# Patient Record
Sex: Female | Born: 1990 | Race: Black or African American | Hispanic: No | Marital: Single | Smoking: Current every day smoker
Health system: Southern US, Community
[De-identification: ages and names within clinical notes are randomized; demographics above are authoritative.]

## PROBLEM LIST (undated history)

## (undated) ENCOUNTER — Inpatient Hospital Stay (HOSPITAL_COMMUNITY): Payer: Self-pay

## (undated) DIAGNOSIS — F419 Anxiety disorder, unspecified: Secondary | ICD-10-CM

## (undated) DIAGNOSIS — I10 Essential (primary) hypertension: Secondary | ICD-10-CM

## (undated) DIAGNOSIS — J45909 Unspecified asthma, uncomplicated: Secondary | ICD-10-CM

## (undated) DIAGNOSIS — Z8632 Personal history of gestational diabetes: Secondary | ICD-10-CM

## (undated) HISTORY — PX: DILATION AND CURETTAGE OF UTERUS: SHX78

## (undated) HISTORY — DX: Personal history of gestational diabetes: Z86.32

## (undated) HISTORY — PX: TONSILLECTOMY: SUR1361

---
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
## 2008-01-18 ENCOUNTER — Inpatient Hospital Stay (HOSPITAL_COMMUNITY): Admission: AD | Admit: 2008-01-18 | Discharge: 2008-01-18 | Payer: Self-pay | Admitting: Obstetrics

## 2008-02-16 ENCOUNTER — Inpatient Hospital Stay (HOSPITAL_COMMUNITY): Admission: AD | Admit: 2008-02-16 | Discharge: 2008-02-19 | Payer: Self-pay | Admitting: Obstetrics & Gynecology

## 2008-02-16 ENCOUNTER — Inpatient Hospital Stay (HOSPITAL_COMMUNITY): Admission: AD | Admit: 2008-02-16 | Discharge: 2008-02-16 | Payer: Self-pay | Admitting: Obstetrics & Gynecology

## 2009-10-21 ENCOUNTER — Emergency Department (HOSPITAL_COMMUNITY): Admission: EM | Admit: 2009-10-21 | Discharge: 2009-10-22 | Payer: Self-pay | Admitting: Emergency Medicine

## 2010-02-28 ENCOUNTER — Ambulatory Visit: Payer: Self-pay | Admitting: Gynecology

## 2010-02-28 ENCOUNTER — Inpatient Hospital Stay (HOSPITAL_COMMUNITY)
Admission: AD | Admit: 2010-02-28 | Discharge: 2010-02-28 | Payer: Self-pay | Source: Home / Self Care | Admitting: Obstetrics and Gynecology

## 2010-03-01 ENCOUNTER — Ambulatory Visit: Payer: Self-pay | Admitting: Obstetrics and Gynecology

## 2010-03-01 ENCOUNTER — Inpatient Hospital Stay (HOSPITAL_COMMUNITY)
Admission: AD | Admit: 2010-03-01 | Discharge: 2010-03-01 | Payer: Self-pay | Source: Home / Self Care | Admitting: Obstetrics & Gynecology

## 2010-06-30 ENCOUNTER — Encounter: Payer: Self-pay | Admitting: Obstetrics & Gynecology

## 2010-07-11 ENCOUNTER — Inpatient Hospital Stay (HOSPITAL_COMMUNITY)
Admission: AD | Admit: 2010-07-11 | Discharge: 2010-07-11 | Disposition: A | Discharge status: Home / Self Care | Payer: Self-pay | Source: Ambulatory Visit | Attending: Obstetrics | Admitting: Obstetrics

## 2010-07-11 DIAGNOSIS — O4700 False labor before 37 completed weeks of gestation, unspecified trimester: Secondary | ICD-10-CM

## 2010-07-11 LAB — URINE MICROSCOPIC-ADD ON

## 2010-07-11 LAB — URINALYSIS, ROUTINE W REFLEX MICROSCOPIC
Bilirubin Urine: NEGATIVE
Leukocytes, UA: NEGATIVE
Nitrite: NEGATIVE
Protein, ur: NEGATIVE mg/dL
Specific Gravity, Urine: <1.005 — ABNORMAL LOW (ref 1.005–1.030)
pH: 6 (ref 5.0–8.0)

## 2010-07-13 LAB — URINE CULTURE

## 2010-08-02 ENCOUNTER — Inpatient Hospital Stay (HOSPITAL_COMMUNITY)
Admission: AD | Admit: 2010-08-02 | Discharge: 2010-08-02 | Disposition: A | Discharge status: Home / Self Care | Payer: Medicaid Other | Source: Ambulatory Visit | Attending: Obstetrics & Gynecology | Admitting: Obstetrics & Gynecology

## 2010-08-02 DIAGNOSIS — O139 Gestational [pregnancy-induced] hypertension without significant proteinuria, unspecified trimester: Secondary | ICD-10-CM

## 2010-08-02 LAB — COMPREHENSIVE METABOLIC PANEL
ALT: 12 U/L (ref 0–35)
AST: 18 U/L (ref 0–37)
Alkaline Phosphatase: 226 U/L — ABNORMAL HIGH (ref 39–117)
BUN: 3 mg/dL — ABNORMAL LOW (ref 6–23)
Creatinine, Ser: 0.49 mg/dL (ref 0.4–1.2)
GFR calc non Af Amer: >60 mL/min (ref >60)
Total Bilirubin: 0.9 mg/dL (ref 0.3–1.2)
Total Protein: 5.6 g/dL — ABNORMAL LOW (ref 6.0–8.3)

## 2010-08-02 LAB — URINALYSIS, ROUTINE W REFLEX MICROSCOPIC
Hgb urine dipstick: NEGATIVE
Ketones, ur: NEGATIVE mg/dL
Specific Gravity, Urine: 1.015 (ref 1.005–1.030)
Urine Glucose, Fasting: NEGATIVE mg/dL
pH: 6 (ref 5.0–8.0)

## 2010-08-02 LAB — CBC
Hemoglobin: 12.5 g/dL (ref 12.0–15.0)
MCHC: 35 g/dL (ref 30.0–36.0)
MCV: 93.9 fL (ref 78.0–100.0)
RBC: 3.8 MIL/uL — ABNORMAL LOW (ref 3.87–5.11)
RDW: 13.1 % (ref 11.5–15.5)
WBC: 7.7 10*3/uL (ref 4.0–10.5)

## 2010-08-02 LAB — URIC ACID: Uric Acid, Serum: 5.3 mg/dL (ref 2.4–7.0)

## 2010-08-15 ENCOUNTER — Inpatient Hospital Stay (HOSPITAL_COMMUNITY): Admission: AD | Admit: 2010-08-15 | Payer: Self-pay | Admitting: Obstetrics & Gynecology

## 2010-08-17 ENCOUNTER — Inpatient Hospital Stay (HOSPITAL_COMMUNITY)
Admission: AD | Admit: 2010-08-17 | Discharge: 2010-08-20 | DRG: 775 | Disposition: A | Discharge status: Home / Self Care | Payer: Medicaid Other | Source: Ambulatory Visit | Attending: Obstetrics | Admitting: Obstetrics

## 2010-08-17 DIAGNOSIS — Z22330 Carrier of Group B streptococcus: Secondary | ICD-10-CM

## 2010-08-17 DIAGNOSIS — O99892 Other specified diseases and conditions complicating childbirth: Principal | ICD-10-CM | POA: Diagnosis present

## 2010-08-17 LAB — CBC
HCT: 37.5 % (ref 36.0–46.0)
MCHC: 35.2 g/dL (ref 30.0–36.0)
MCV: 93.1 fL (ref 78.0–100.0)
RBC: 4.03 MIL/uL (ref 3.87–5.11)
WBC: 8.4 10*3/uL (ref 4.0–10.5)

## 2010-08-19 LAB — CBC
Hemoglobin: 12.3 g/dL (ref 12.0–15.0)
MCH: 32.5 pg (ref 26.0–34.0)
MCHC: 34.3 g/dL (ref 30.0–36.0)
Platelets: 124 10*3/uL — ABNORMAL LOW (ref 150–400)
RDW: 13.2 % (ref 11.5–15.5)

## 2010-08-22 LAB — CBC
HCT: 35.9 % — ABNORMAL LOW (ref 36.0–46.0)
HCT: 36.2 % (ref 36.0–46.0)
Hemoglobin: 12.5 g/dL (ref 12.0–15.0)
Hemoglobin: 12.6 g/dL (ref 12.0–15.0)
MCHC: 34.7 g/dL (ref 30.0–36.0)
RBC: 3.83 MIL/uL — ABNORMAL LOW (ref 3.87–5.11)
WBC: 6.6 10*3/uL (ref 4.0–10.5)

## 2010-08-22 LAB — COMPREHENSIVE METABOLIC PANEL
ALT: 11 U/L (ref 0–35)
ALT: 12 U/L (ref 0–35)
Alkaline Phosphatase: 70 U/L (ref 39–117)
Alkaline Phosphatase: 73 U/L (ref 39–117)
CO2: 23 mEq/L (ref 19–32)
CO2: 27 mEq/L (ref 19–32)
Calcium: 8.6 mg/dL (ref 8.4–10.5)
GFR calc non Af Amer: >60 mL/min (ref >60)
GFR calc non Af Amer: >60 mL/min (ref >60)
Glucose, Bld: 81 mg/dL (ref 70–99)
Glucose, Bld: 95 mg/dL (ref 70–99)
Potassium: 3 mEq/L — ABNORMAL LOW (ref 3.5–5.1)
Sodium: 134 mEq/L — ABNORMAL LOW (ref 135–145)
Sodium: 137 mEq/L (ref 135–145)
Total Protein: 6.1 g/dL (ref 6.0–8.3)

## 2010-08-22 LAB — URINALYSIS, ROUTINE W REFLEX MICROSCOPIC
Glucose, UA: NEGATIVE mg/dL
Ketones, ur: NEGATIVE mg/dL
Specific Gravity, Urine: 1.025 (ref 1.005–1.030)
Urobilinogen, UA: 0.2 mg/dL (ref 0.0–1.0)
pH: 5.5 (ref 5.0–8.0)

## 2010-08-22 LAB — HCG, QUANTITATIVE, PREGNANCY: hCG, Beta Chain, Quant, S: 15085 m[IU]/mL — ABNORMAL HIGH (ref <5)

## 2010-08-22 LAB — WET PREP, GENITAL

## 2010-08-26 LAB — WET PREP, GENITAL
WBC, Wet Prep HPF POC: NONE SEEN
Yeast Wet Prep HPF POC: NONE SEEN

## 2010-08-26 LAB — POCT PREGNANCY, URINE: Preg Test, Ur: NEGATIVE

## 2010-08-26 LAB — URINALYSIS, ROUTINE W REFLEX MICROSCOPIC
Glucose, UA: NEGATIVE mg/dL
Ketones, ur: 15 mg/dL — AB
Protein, ur: NEGATIVE mg/dL

## 2010-08-26 LAB — GC/CHLAMYDIA PROBE AMP, GENITAL
Chlamydia, DNA Probe: NEGATIVE
GC Probe Amp, Genital: NEGATIVE

## 2010-08-26 LAB — URINE CULTURE

## 2010-09-26 NOTE — Discharge Summary (Signed)
  Abigail Hardin, Abigail Hardin                ACCOUNT NO.:  [id number]  MEDICAL RECORD NO.:  [id number]           PATIENT TYPE:  I  LOCATION:  9134                          FACILITY:  [facility]  PHYSICIAN:  Charles A. Clearance Coots, M.D.DATE OF BIRTH:  1991/01/19  DATE OF ADMISSION:  08/17/2010 DATE OF DISCHARGE:  08/20/2010                              DISCHARGE SUMMARY   ADMITTING DIAGNOSIS:  Term pregnancy, early labor.  DISCHARGE DIAGNOSIS:  Term pregnancy, early labor, status post normal spontaneous vaginal delivery.  Viable female infant on August 18, 2010, at 0803.  Apgars 9 at 1 minute and 9 at 5 minutes, weight of 3115 g, length of 48.90 cm.  Mother and infant discharged home in good condition.  REASON FOR ADMISSION:  A 20 year old G2, P1 estimated date of confinement, August 15, 2010, presents with uterine contractions.  PAST MEDICAL HISTORY:  Please see medical records for full historical data.  PHYSICAL EXAM:  GENERAL:  Well-nourished, well-developed female in no acute distress, afebrile. VITAL SIGNS:  Stable. LUNGS:  Clear to auscultation bilaterally. HEART:  Regular rate and rhythm. ABDOMEN:  Soft, gravid, nontender. PELVIC:  Cervix 3 cm, 80% effacement and vertex at -1 station.  ADMITTING LABORATORY FINDINGS:  Hemoglobin 12, hematocrit 37, white blood cell count 8400, platelets 146,000.  RPR was nonreactive.  HOSPITAL COURSE:  The patient was admitted and membranes were actively ruptured and she progressed very rapidly to normal spontaneous vaginal delivery without complications.  Postpartum course was uncomplicated. She was discharged home on postpartum day #2 in good condition.  DISCHARGE LABORATORY FINDINGS:  Hemoglobin 12, hematocrit 35, white blood cell count 10,000, platelets 114,000.  DISCHARGE DISPOSITION:  Medications, continue prenatal vitamins. Ibuprofen was prescribed for pain.  Routine written instructions were given for discharge after vaginal delivery.  The patient  is to call office for followup appointment in 6 weeks.     Charles A. Clearance Coots, M.D.     CAH/MEDQ  D:  09/12/2010  T:  09/13/2010  Job:  [id number]  Electronically Signed by Coral Ceo M.D. on 09/26/2010 02:30:18 PM

## 2010-10-25 NOTE — H&P (Signed)
Abigail Hardin, Abigail Hardin                ACCOUNT NO.:  [id number]   MEDICAL RECORD NO.:  [id number]          PATIENT TYPE:  INP   LOCATION:  9304                          FACILITY:  [facility]   PHYSICIAN:  Naima A. Dillard, M.D. DATE OF BIRTH:  May 07, 1991   DATE OF ADMISSION:  11/23/2005  DATE OF DISCHARGE:                                HISTORY & PHYSICAL   CHIEF COMPLAINT:  Pelvic inflammatory disease and pyelonephritis.   HISTORY OF PRESENT ILLNESS:  The patient is a 20 year old, gravida 1, para 0-  0-1-0, with last menstrual period November 16, 2005, who presented to the ER  with nausea and vomiting, lower abdominal pain and back pain with headaches  for two days.  The patient says that she last had sex two months ago using  condoms most of the time.  Never had any sexually transmitted disease or PID  in the past.  The patient denies being allergic to any medication. She is  currently not taking any medication.   PAST MEDICAL HISTORY:  Unremarkable.   PAST SURGICAL HISTORY:  Significant for tonsillectomy at a young age.   PAST OB HISTORY:  Significant for spontaneous abortion in the first  trimester when she had a dilatation and curettage.   REVIEW OF SYSTEMS:  Cardiovascular, respiratory, musculoskeletal,  psychiatric, and endocrine are all unremarkable.  GU:  As above.   FAMILY HISTORY:  Unremarkable.   SOCIAL HISTORY:  Significant for tobacco and marijuana use.  No alcohol use.   PHYSICAL EXAMINATION:  VITAL SIGNS:  T-max in the ER this morning was 103.3.  Current temperature 99.9.  Vital signs are stable.  GENERAL:  The patient is in no apparent distress.  HEENT:  Within normal limits  NECK:  Free range of motion and supple.  LUNGS:  Clear to auscultation bilaterally.  HEART:  Regular rate and rhythm.  ABDOMEN:  Nondistended but diffuse lower abdominal tenderness.  No rebound  tenderness, no guarding.  GU:  Full vaginal exam is within normal limits.  Cervix does have some  cervical motion tenderness.  Uterus is top normal size and tender, mobile.  Bilateral adnexal tenderness.  EXTREMITIES:  No cyanosis, clubbing or edema.  NEUROLOGIC:  Within normal limits.   ASSESSMENT:  Pelvic inflammatory disease and pyelonephritis.   LABORATORY DATA:  Urinalysis actually looked negative, but on CT scan there  is significant right-sided pyelonephritis and __________ cervicitis and PID  with left adnexal mass measuring 3.2 cm with a questionable TOA.  White  count is 14.4 with a left shift of 89 segs.  Hemoglobin 15.3.  Platelets  142.   PLAN:  Antibiotics with cefotetan and doxycycline. We will use Phenergan  p.r.n.  Check urine cultures and GC and Chlamydia cultures.  Also offer  other sexually transmitted disease testing such has HIV, hepatitis and  syphilis.  Check an ultrasound in the morning to determine if this is a tubo-  ovarian abscess or just a functional cyst and recheck a CBC in the morning.  The patient agrees with the plan.  Her urine pregnancy test was negative.  Naima A. Normand Sloop, M.D.     NAD/MEDQ  D:  11/23/2005  T:  11/24/2005  Job:  [id number]

## 2010-10-25 NOTE — Discharge Summary (Signed)
Abigail Hardin, Abigail Hardin                ACCOUNT NO.:  [id number]   MEDICAL RECORD NO.:  [id number]          PATIENT TYPE:  INP   LOCATION:  9304                          FACILITY:  [facility]   PHYSICIAN:  Naima A. Dillard, M.D. DATE OF BIRTH:  1990-08-11   DATE OF ADMISSION:  11/24/2005  DATE OF DISCHARGE:  11/26/2005                                 DISCHARGE SUMMARY   DISCHARGE DIAGNOSIS:  Pelvic inflammatory disease.   PROCEDURES:  On November 22, 2005, the patient underwent a transvaginal  ultrasound which revealed a left ovarian cyst, measuring 1.6 x 2.1 cm; the  patient's uterus and endometrium appeared within normal limits for her age;  there was small to moderate amount of free fluid without any evidence of  hydrosalpinges; there was a cylindrical structure in the left hemipelvis,  likely attributed to dilated bowel floating in peritoneal fluid.   HISTORY OF PRESENT ILLNESS:  Abigail Hardin is a 20 year old female, gravida 1,  para 0-0-1-0, who presented with complaints of lower abdominal and back pain  along with nausea and vomiting of 2 days duration.  Please see the patient's  dictated history and physical examination for details.   ADMISSION PHYSICAL EXAM:  Maximum temperature in the emergency department,  on morning of arrival, was 103.3 degrees Fahrenheit orally.  The patient's  temperature at the time of exam was 99.9 degrees Fahrenheit orally.  Her  other vital signs were stable.  General exam was within normal limits.  Do  note on abdominal exam, her abdomen was nondistended with diffuse tenderness  in her lower abdominal quadrant.  There was no rebound tenderness or  guarding.  GU exam:  Full vaginal exam is within normal limits.  Cervix did  not have motion tenderness.  Uterus is upper limits of normal size, tender  and mobile.  There was bilateral adnexal tenderness.   HOSPITAL COURSE:  On the date of admission, the patient was started on  cefotetan and doxycycline IV antibiotics.   Her admission CBC showed a white  count of 9.6, hemoglobin 12.6, hematocrit 36.2 and platelets 136.  Within 24  hours the patient was afebrile and remained so for the remainder of her  hospital course.  She began to experience significant decrease in her pain,  and a follow up CBC further indicated the patient's improvement with her  white count being 7.7, hemoglobin 11.4, hematocrit 33.2 and platelets 143.  By hospital day #3, the patient's abdominal pain had markedly decreased.  She was able to tolerate a regular diet and was therefore deemed ready for  discharge home.  The patient's HIV, hepatitis B, RPR and urinalysis all  returned negative.   DISCHARGE MEDICATIONS:  1.  Vicodin 1 tablet every 4 hours as needed for pain.  2.  Doxycycline 100 mg twice daily for 12 days.  3.  Metronidazole 500 mg four times daily for 12 days.   FOLLOW UP:  The patient is scheduled for a follow-up visit with Dr. Normand Sloop  on December 05, 2005, at 10 o'clock.  She was further advised to arrive at 10:00  a.m.  for this appointment.   DISCHARGE INSTRUCTIONS:  The patient was told to call with any problems,  that she may increase her activities slowly, may shower and bathe, walk up  steps and should avoid intercourse for 6 weeks.  Her diet was without  restriction.      Elmira J. Adline Peals.      Naima A. Normand Sloop, M.D.  Electronically Signed    EJP/MEDQ  D:  12/21/2005  T:  12/21/2005  Job:  [id number]

## 2010-11-13 ENCOUNTER — Emergency Department (HOSPITAL_COMMUNITY)
Admission: EM | Admit: 2010-11-13 | Discharge: 2010-11-13 | Disposition: A | Discharge status: Home / Self Care | Payer: Self-pay | Attending: Emergency Medicine | Admitting: Emergency Medicine

## 2010-11-13 ENCOUNTER — Emergency Department (HOSPITAL_COMMUNITY): Payer: Self-pay

## 2010-11-13 DIAGNOSIS — M79609 Pain in unspecified limb: Secondary | ICD-10-CM | POA: Insufficient documentation

## 2010-11-13 DIAGNOSIS — W268XXA Contact with other sharp object(s), not elsewhere classified, initial encounter: Secondary | ICD-10-CM | POA: Insufficient documentation

## 2010-11-13 DIAGNOSIS — S91109A Unspecified open wound of unspecified toe(s) without damage to nail, initial encounter: Secondary | ICD-10-CM | POA: Insufficient documentation

## 2011-02-27 LAB — URINALYSIS, ROUTINE W REFLEX MICROSCOPIC
Bilirubin Urine: NEGATIVE
Glucose, UA: NEGATIVE
Hgb urine dipstick: NEGATIVE
Specific Gravity, Urine: 1.025
Urobilinogen, UA: 0.2
pH: 6

## 2011-02-27 LAB — URINE MICROSCOPIC-ADD ON

## 2011-02-27 LAB — CBC
HCT: 37
Hemoglobin: 13.2
MCV: 92.9
RBC: 3.99
WBC: 5.1

## 2011-02-27 LAB — COMPREHENSIVE METABOLIC PANEL
Albumin: 3.5
Alkaline Phosphatase: 59
BUN: 6
CO2: 25
Chloride: 106
Creatinine, Ser: 0.56
GFR calc non Af Amer: >60
Potassium: 4
Total Bilirubin: 1.6 — ABNORMAL HIGH

## 2011-02-27 LAB — POCT PREGNANCY, URINE
Operator id: 263291
Preg Test, Ur: POSITIVE

## 2011-02-27 LAB — WET PREP, GENITAL

## 2011-02-28 LAB — URINALYSIS, ROUTINE W REFLEX MICROSCOPIC
Glucose, UA: NEGATIVE
Hgb urine dipstick: NEGATIVE
Ketones, ur: 40 — AB
Protein, ur: NEGATIVE
Urobilinogen, UA: 0.2

## 2011-03-06 LAB — URINE MICROSCOPIC-ADD ON

## 2011-03-06 LAB — URINALYSIS, ROUTINE W REFLEX MICROSCOPIC
Bilirubin Urine: NEGATIVE
Glucose, UA: NEGATIVE
Ketones, ur: NEGATIVE
Protein, ur: 30 — AB
pH: 6

## 2011-03-12 LAB — CBC
HCT: 38.6
Hemoglobin: 11.1 — ABNORMAL LOW
Platelets: 154
RBC: 3.26 — ABNORMAL LOW
RDW: 12.6
WBC: 10.4
WBC: 7.5

## 2011-03-12 LAB — RPR: RPR Ser Ql: NONREACTIVE

## 2011-04-09 HISTORY — PX: INDUCED ABORTION: SHX677

## 2011-12-27 ENCOUNTER — Inpatient Hospital Stay (HOSPITAL_COMMUNITY): Payer: Self-pay

## 2011-12-27 ENCOUNTER — Inpatient Hospital Stay (HOSPITAL_COMMUNITY)
Admission: AD | Admit: 2011-12-27 | Discharge: 2011-12-27 | Disposition: A | Discharge status: Home / Self Care | Payer: Self-pay | Source: Ambulatory Visit | Attending: Obstetrics & Gynecology | Admitting: Obstetrics & Gynecology

## 2011-12-27 ENCOUNTER — Encounter (HOSPITAL_COMMUNITY): Payer: Self-pay | Admitting: Obstetrics and Gynecology

## 2011-12-27 DIAGNOSIS — O2390 Unspecified genitourinary tract infection in pregnancy, unspecified trimester: Secondary | ICD-10-CM | POA: Insufficient documentation

## 2011-12-27 DIAGNOSIS — N390 Urinary tract infection, site not specified: Secondary | ICD-10-CM

## 2011-12-27 DIAGNOSIS — R197 Diarrhea, unspecified: Secondary | ICD-10-CM | POA: Insufficient documentation

## 2011-12-27 DIAGNOSIS — B9689 Other specified bacterial agents as the cause of diseases classified elsewhere: Secondary | ICD-10-CM

## 2011-12-27 DIAGNOSIS — O210 Mild hyperemesis gravidarum: Secondary | ICD-10-CM | POA: Insufficient documentation

## 2011-12-27 DIAGNOSIS — R109 Unspecified abdominal pain: Secondary | ICD-10-CM | POA: Insufficient documentation

## 2011-12-27 DIAGNOSIS — N12 Tubulo-interstitial nephritis, not specified as acute or chronic: Secondary | ICD-10-CM | POA: Insufficient documentation

## 2011-12-27 DIAGNOSIS — O2340 Unspecified infection of urinary tract in pregnancy, unspecified trimester: Secondary | ICD-10-CM

## 2011-12-27 HISTORY — DX: Unspecified asthma, uncomplicated: J45.909

## 2011-12-27 LAB — URINALYSIS, ROUTINE W REFLEX MICROSCOPIC
Glucose, UA: NEGATIVE mg/dL
Protein, ur: 100 mg/dL — AB
Specific Gravity, Urine: 1.02 (ref 1.005–1.030)
pH: 6 (ref 5.0–8.0)

## 2011-12-27 LAB — CBC WITH DIFFERENTIAL/PLATELET
Basophils Absolute: 0 10*3/uL (ref 0.0–0.1)
Eosinophils Absolute: 0 10*3/uL (ref 0.0–0.7)
Eosinophils Relative: 0 % (ref 0–5)
HCT: 33.2 % — ABNORMAL LOW (ref 36.0–46.0)
Lymphocytes Relative: 4 % — ABNORMAL LOW (ref 12–46)
MCH: 31.4 pg (ref 26.0–34.0)
MCV: 89 fL (ref 78.0–100.0)
Monocytes Absolute: 0.9 10*3/uL (ref 0.1–1.0)
RDW: 13.2 % (ref 11.5–15.5)
WBC: 13.9 10*3/uL — ABNORMAL HIGH (ref 4.0–10.5)

## 2011-12-27 LAB — COMPREHENSIVE METABOLIC PANEL
Albumin: 3 g/dL — ABNORMAL LOW (ref 3.5–5.2)
BUN: 7 mg/dL (ref 6–23)
CO2: 22 mEq/L (ref 19–32)
Calcium: 8.6 mg/dL (ref 8.4–10.5)
Chloride: 99 mEq/L (ref 96–112)
Creatinine, Ser: 0.6 mg/dL (ref 0.50–1.10)
GFR calc non Af Amer: >90 mL/min (ref >90)
Total Bilirubin: 0.9 mg/dL (ref 0.3–1.2)

## 2011-12-27 LAB — WET PREP, GENITAL
Trich, Wet Prep: NONE SEEN
Yeast Wet Prep HPF POC: NONE SEEN

## 2011-12-27 LAB — URINE MICROSCOPIC-ADD ON

## 2011-12-27 LAB — POCT PREGNANCY, URINE: Preg Test, Ur: POSITIVE — AB

## 2011-12-27 MED ORDER — SODIUM CHLORIDE 0.9 % IV BOLUS (SEPSIS): 1000.0000 mL | Freq: Once | INTRAVENOUS | Status: DC

## 2011-12-27 MED ORDER — SODIUM CHLORIDE 0.45 % IV SOLN
INTRAVENOUS | Status: DC
  Administered 2011-12-27: 21:00:00 via INTRAVENOUS
  Filled 2011-12-27 (×5): qty 1000

## 2011-12-27 MED ORDER — METRONIDAZOLE 500 MG PO TABS: 500.0000 mg | ORAL_TABLET | Freq: Two times a day (BID) | ORAL | Status: AC

## 2011-12-27 MED ORDER — CEPHALEXIN 500 MG PO CAPS: 500.0000 mg | ORAL_CAPSULE | Freq: Four times a day (QID) | ORAL | Status: AC

## 2011-12-27 MED ORDER — ONDANSETRON 8 MG PO TBDP
8.0000 mg | ORAL_TABLET | Freq: Once | ORAL | Status: DC
  Filled 2011-12-27: qty 1

## 2011-12-27 MED ORDER — DEXTROSE 5 % IV SOLN
1.0000 g | Freq: Once | INTRAVENOUS | Status: AC
  Administered 2011-12-27: 1 g via INTRAVENOUS
  Filled 2011-12-27: qty 10

## 2011-12-27 MED ORDER — ONDANSETRON HCL 4 MG PO TABS: 4.0000 mg | ORAL_TABLET | Freq: Four times a day (QID) | ORAL | Status: AC

## 2011-12-27 MED ORDER — ONDANSETRON HCL 4 MG/2ML IJ SOLN
4.0000 mg | Freq: Once | INTRAMUSCULAR | Status: AC
  Administered 2011-12-27: 4 mg via INTRAVENOUS
  Filled 2011-12-27: qty 2

## 2011-12-27 NOTE — MAU Note (Signed)
"  I haven't had a period since May 8th.  I throw up every morning.  I can feel the baby moving around.  I have two kids already, so it's just logical that I'm pregnant.  I have an abortion scheduled for this coming Tuesday.  I'm just trying to feel better today."

## 2011-12-27 NOTE — MAU Provider Note (Signed)
History     CSN: [id number]  Arrival date and time: 12/27/11 1712   None     Chief Complaint  Patient presents with  . Emesis   HPI Abigail Hardin is a 21 y.o. female who presents to MAU for abdominal pain. The pain started 2 days ago. The pain is located in the lower abdomen. The pain radiates to the lower back and into the vaginal area. She rates the pain as 7/10. Associated symptoms include nausea and vomiting, diarrhea, vaginal discharge, chills and fever.  The emesis is described as yellow liquid. The stools are described as watery dark. Vaginal discharge is white and has a really bad odor. She reports fever this morning up to 101. Took Advil for pain and fever earlier today.  No birth control, current sex partner x 2 years unprotected sex. Hx of Chlamydia, trichomonas and  PID. Pain today feels similar to the pain with PID.  Last pap smear less than one year ago and was normal. The history was provided by the patient.  OB History    No data available      No past medical history on file.  No past surgical history on file.  No family history on file.  History  Substance Use Topics  . Smoking status: Not on file  . Smokeless tobacco: Not on file  . Alcohol Use: Not on file    Allergies: No Known Allergies  Prescriptions prior to admission  Medication Sig Dispense Refill  . ibuprofen (ADVIL,MOTRIN) 200 MG tablet Take 200 mg by mouth every 6 (six) hours as needed. For pain        Review of Systems  Constitutional: Positive for fever and chills. Negative for weight loss.  HENT: Negative for ear pain, nosebleeds, congestion, sore throat and neck pain.   Eyes: Negative for blurred vision, double vision, photophobia and pain.  Respiratory: Negative for cough, shortness of breath and wheezing.   Cardiovascular: Negative for chest pain and palpitations.  Gastrointestinal: Positive for heartburn, nausea, vomiting, abdominal pain and diarrhea. Negative for constipation.    Genitourinary: Positive for dysuria, urgency and frequency.  Musculoskeletal: Positive for back pain. Negative for myalgias.  Skin: Negative for itching and rash.  Neurological: Positive for dizziness and headaches. Negative for sensory change, speech change, seizures and weakness.  Endo/Heme/Allergies: Does not bruise/bleed easily.  Psychiatric/Behavioral: Negative for depression. The patient is not nervous/anxious.    Physical Exam   Blood pressure 120/68, pulse 104, temperature 98.8 F (37.1 C), temperature source Oral, resp. rate 18, height 5\' 4"  (1.626 m), weight 173 lb (78.472 kg), last menstrual period 10/15/2011.  Physical Exam  Nursing note and vitals reviewed. Constitutional: She is oriented to person, place, and time. She appears well-developed and well-nourished. No distress.  HENT:  Head: Normocephalic and atraumatic.  Eyes: EOM are normal.  Neck: Neck supple.  Cardiovascular:       Tachycardia   Respiratory: Effort normal.  GI: Soft. There is tenderness in the right lower quadrant, suprapubic area and left lower quadrant. There is CVA tenderness. There is no rigidity, no rebound and no guarding.  Genitourinary: Vagina normal.       External genitalia without lesions. Malodorous frothy discharge vaginal vault. Cervix inflamed, mild CMT, bilateral adnexal tenderness is mild. Uterus approximately 10 - 12 week size.   Musculoskeletal: Normal range of motion.  Neurological: She is alert and oriented to person, place, and time.  Skin: Skin is warm and dry.  Psychiatric: She  has a normal mood and affect. Her behavior is normal. Judgment and thought content normal.     *RADIOLOGY REPORT*  Clinical Data: Back pain. Positive urine pregnancy test. LMP  10/15/2011. Gravida 5, para 2, aborta 2. By LMP, the patient is  EDC is 07/21/2012.  OBSTETRIC <14 WK ULTRASOUND  Technique: Transabdominal ultrasound was performed for evaluation  of the gestation as well as the maternal  uterus and adnexal  regions.  Comparison: None.  Intrauterine gestational sac: Visualized/normal in shape.  Yolk sac: Not seen  Embryo: Present  Cardiac Activity: Present  Heart Rate: 170 bpm  CRL: 45 mm 11 w 3 d [place] EDC: 07/14/2012  Maternal uterus/Adnexae:  Ovaries have a normal appearance. No evidence for subchorionic  hemorrhage.  IMPRESSION:  Single living intrauterine embryo corresponding to an age of 11  weeks 3 days.  Dating by ultrasound yields EDC of 07/14/2012, slightly different  than dating expected based on LMP.  Original Report Authenticated By: Patterson Hammersmith, M.D.   MAU Course  Procedures  Results for orders placed during the hospital encounter of 12/27/11 (from the past 24 hour(s))  URINALYSIS, ROUTINE W REFLEX MICROSCOPIC     Status: Abnormal   Collection Time   12/27/11  5:26 PM      Component Value Range   Color, Urine YELLOW  YELLOW   APPearance CLOUDY (*) CLEAR   Specific Gravity, Urine 1.020  1.005 - 1.030   pH 6.0  5.0 - 8.0   Glucose, UA NEGATIVE  NEGATIVE mg/dL   Hgb urine dipstick LARGE (*) NEGATIVE   Bilirubin Urine NEGATIVE  NEGATIVE   Ketones, ur NEGATIVE  NEGATIVE mg/dL   Protein, ur 562 (*) NEGATIVE mg/dL   Urobilinogen, UA 0.2  0.0 - 1.0 mg/dL   Nitrite POSITIVE (*) NEGATIVE   Leukocytes, UA MODERATE (*) NEGATIVE  URINE MICROSCOPIC-ADD ON     Status: Abnormal   Collection Time   12/27/11  5:26 PM      Component Value Range   Squamous Epithelial / LPF RARE  RARE   WBC, UA TOO NUMEROUS TO COUNT  <3 WBC/hpf   RBC / HPF TOO NUMEROUS TO COUNT  <3 RBC/hpf   Bacteria, UA MANY (*) RARE  POCT PREGNANCY, URINE     Status: Abnormal   Collection Time   12/27/11  5:32 PM      Component Value Range   Preg Test, Ur POSITIVE (*) NEGATIVE  HCG, QUANTITATIVE, PREGNANCY     Status: Abnormal   Collection Time   12/27/11  5:43 PM      Component Value Range   hCG, Beta Chain, Sharene Butters, S 13086 (*) <5 mIU/mL  CBC WITH DIFFERENTIAL     Status: Abnormal     Collection Time   12/27/11  5:53 PM      Component Value Range   WBC 13.9 (*) 4.0 - 10.5 K/uL   RBC 3.73 (*) 3.87 - 5.11 MIL/uL   Hemoglobin 11.7 (*) 12.0 - 15.0 g/dL   HCT 57.8 (*) 46.9 - 62.9 %   MCV 89.0  78.0 - 100.0 fL   MCH 31.4  26.0 - 34.0 pg   MCHC 35.2  30.0 - 36.0 g/dL   RDW 52.8  41.3 - 24.4 %   Platelets 156  150 - 400 K/uL   Neutrophils Relative 89 (*) 43 - 77 %   Neutro Abs 12.4 (*) 1.7 - 7.7 K/uL   Lymphocytes Relative 4 (*) 12 - 46 %  Lymphs Abs 0.6 (*) 0.7 - 4.0 K/uL   Monocytes Relative 7  3 - 12 %   Monocytes Absolute 0.9  0.1 - 1.0 K/uL   Eosinophils Relative 0  0 - 5 %   Eosinophils Absolute 0.0  0.0 - 0.7 K/uL   Basophils Relative 0  0 - 1 %   Basophils Absolute 0.0  0.0 - 0.1 K/uL  COMPREHENSIVE METABOLIC PANEL     Status: Abnormal   Collection Time   12/27/11  5:53 PM      Component Value Range   Sodium 133 (*) 135 - 145 mEq/L   Potassium 2.9 (*) 3.5 - 5.1 mEq/L   Chloride 99  96 - 112 mEq/L   CO2 22  19 - 32 mEq/L   Glucose, Bld 118 (*) 70 - 99 mg/dL   BUN 7  6 - 23 mg/dL   Creatinine, Ser 4.78  0.50 - 1.10 mg/dL   Calcium 8.6  8.4 - 29.5 mg/dL   Total Protein 6.1  6.0 - 8.3 g/dL   Albumin 3.0 (*) 3.5 - 5.2 g/dL   AST 13  0 - 37 U/L   ALT 10  0 - 35 U/L   Alkaline Phosphatase 70  39 - 117 U/L   Total Bilirubin 0.9  0.3 - 1.2 mg/dL   GFR calc non Af Amer >90  >90 mL/min   GFR calc Af Amer >90  >90 mL/min   Assessment: 21 y.o. @ 11.[redacted] weeks gestation with Pyelonephritis    Nausea, vomiting and diarrhea  Plan:  IV hydration   Rocephin 1 gram IV   Zofran 4 mg IV      NEESE,HOPE, RN, FNP, BC 12/27/2011, 5:53 PM   Care turned over to Candelaria Celeste, MD @ 20:00 pm  Symptoms improved with antiemetic and IV fluids.  Patient s.p rocephin.  Will d/c to home with Flagyl and keflex for BV and UTI.  Discussed warning signs and when to return: if not able to tolerate antibiotics, fevers of 100.4 or greater, difficulty breathing.  Patient voiced  understanding.  Abigail Heritage, DO 12/27/2011 10:23 PM

## 2011-12-27 NOTE — MAU Note (Addendum)
Pt reports having nausea and vomiting since 5am. C/o chills and sweating and abd pain and pressure that radiates towards her back. Reports having a clear to brown vaginal discharge with an odor. Pt has not had period since May 8th knows she is pregnant. Plans to have and abortion next week.

## 2011-12-29 LAB — GC/CHLAMYDIA PROBE AMP, GENITAL
Chlamydia, DNA Probe: NEGATIVE
GC Probe Amp, Genital: NEGATIVE

## 2011-12-30 LAB — URINE CULTURE

## 2012-01-01 ENCOUNTER — Telehealth: Payer: Self-pay | Admitting: Advanced Practice Midwife

## 2012-01-01 NOTE — Telephone Encounter (Signed)
Pt Rx Keflex for pyelonephritis. Culture shows only intermediate sensitivity. Called to assess effectiveness, possible need to change ABX. Left VM to call MAU.

## 2013-01-24 ENCOUNTER — Emergency Department (HOSPITAL_COMMUNITY): Payer: No Typology Code available for payment source

## 2013-01-24 ENCOUNTER — Emergency Department (HOSPITAL_COMMUNITY)
Admission: EM | Admit: 2013-01-24 | Discharge: 2013-01-24 | Disposition: A | Discharge status: Home / Self Care | Payer: No Typology Code available for payment source | Attending: Emergency Medicine | Admitting: Emergency Medicine

## 2013-01-24 ENCOUNTER — Encounter (HOSPITAL_COMMUNITY): Payer: Self-pay | Admitting: Nurse Practitioner

## 2013-01-24 DIAGNOSIS — J45909 Unspecified asthma, uncomplicated: Secondary | ICD-10-CM | POA: Insufficient documentation

## 2013-01-24 DIAGNOSIS — S4980XA Other specified injuries of shoulder and upper arm, unspecified arm, initial encounter: Secondary | ICD-10-CM | POA: Insufficient documentation

## 2013-01-24 DIAGNOSIS — S46909A Unspecified injury of unspecified muscle, fascia and tendon at shoulder and upper arm level, unspecified arm, initial encounter: Secondary | ICD-10-CM | POA: Insufficient documentation

## 2013-01-24 DIAGNOSIS — F17200 Nicotine dependence, unspecified, uncomplicated: Secondary | ICD-10-CM | POA: Insufficient documentation

## 2013-01-24 DIAGNOSIS — Y92410 Unspecified street and highway as the place of occurrence of the external cause: Secondary | ICD-10-CM | POA: Insufficient documentation

## 2013-01-24 DIAGNOSIS — IMO0002 Reserved for concepts with insufficient information to code with codable children: Secondary | ICD-10-CM | POA: Insufficient documentation

## 2013-01-24 DIAGNOSIS — S0993XA Unspecified injury of face, initial encounter: Secondary | ICD-10-CM | POA: Insufficient documentation

## 2013-01-24 DIAGNOSIS — Y9389 Activity, other specified: Secondary | ICD-10-CM | POA: Insufficient documentation

## 2013-01-24 MED ORDER — METHOCARBAMOL 500 MG PO TABS: 500.0000 mg | ORAL_TABLET | Freq: Two times a day (BID) | ORAL | Status: DC

## 2013-01-24 MED ORDER — NAPROXEN 500 MG PO TABS: 500.0000 mg | ORAL_TABLET | Freq: Two times a day (BID) | ORAL | Status: DC

## 2013-01-24 MED ORDER — HYDROCODONE-ACETAMINOPHEN 5-325 MG PO TABS: 1.0000 | ORAL_TABLET | Freq: Four times a day (QID) | ORAL | Status: DC | PRN

## 2013-01-24 NOTE — ED Provider Notes (Signed)
Medical screening examination/treatment/procedure(s) were performed by non-physician practitioner and as supervising physician I was immediately available for consultation/collaboration.   Joya Gaskins, MD 01/24/13 [phone]
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
## 2014-04-10 ENCOUNTER — Encounter (HOSPITAL_COMMUNITY): Payer: Self-pay | Admitting: Nurse Practitioner

## 2014-12-21 ENCOUNTER — Encounter (HOSPITAL_COMMUNITY): Payer: Self-pay | Admitting: Emergency Medicine

## 2014-12-21 ENCOUNTER — Emergency Department (HOSPITAL_COMMUNITY)
Admission: EM | Admit: 2014-12-21 | Discharge: 2014-12-21 | Disposition: A | Discharge status: Home / Self Care | Payer: Self-pay | Attending: Emergency Medicine | Admitting: Emergency Medicine

## 2014-12-21 DIAGNOSIS — Z791 Long term (current) use of non-steroidal anti-inflammatories (NSAID): Secondary | ICD-10-CM | POA: Insufficient documentation

## 2014-12-21 DIAGNOSIS — Z720 Tobacco use: Secondary | ICD-10-CM | POA: Insufficient documentation

## 2014-12-21 DIAGNOSIS — R300 Dysuria: Secondary | ICD-10-CM | POA: Insufficient documentation

## 2014-12-21 DIAGNOSIS — R1030 Lower abdominal pain, unspecified: Secondary | ICD-10-CM | POA: Insufficient documentation

## 2014-12-21 DIAGNOSIS — K0889 Other specified disorders of teeth and supporting structures: Secondary | ICD-10-CM

## 2014-12-21 DIAGNOSIS — R110 Nausea: Secondary | ICD-10-CM | POA: Insufficient documentation

## 2014-12-21 DIAGNOSIS — R350 Frequency of micturition: Secondary | ICD-10-CM | POA: Insufficient documentation

## 2014-12-21 DIAGNOSIS — Z79899 Other long term (current) drug therapy: Secondary | ICD-10-CM | POA: Insufficient documentation

## 2014-12-21 DIAGNOSIS — J45909 Unspecified asthma, uncomplicated: Secondary | ICD-10-CM | POA: Insufficient documentation

## 2014-12-21 DIAGNOSIS — K088 Other specified disorders of teeth and supporting structures: Secondary | ICD-10-CM | POA: Insufficient documentation

## 2014-12-21 MED ORDER — NAPROXEN 500 MG PO TABS: 500.0000 mg | ORAL_TABLET | Freq: Two times a day (BID) | ORAL | Status: DC

## 2014-12-21 MED ORDER — HYDROCODONE-ACETAMINOPHEN 5-325 MG PO TABS
1.0000 | ORAL_TABLET | Freq: Once | ORAL | Status: AC
  Administered 2014-12-21: 1 via ORAL
  Filled 2014-12-21: qty 1

## 2014-12-21 MED ORDER — PENICILLIN V POTASSIUM 500 MG PO TABS: 500.0000 mg | ORAL_TABLET | Freq: Four times a day (QID) | ORAL | Status: AC

## 2014-12-21 NOTE — ED Provider Notes (Signed)
CSN: [id number]     Arrival date & time 12/21/14  2119 History  This chart was scribed for Abigail Peek, PA-C, working with Mirian Mo, MD by Chestine Spore, ED Scribe. The patient was seen in room TR05C/TR05C at 9:41 PM.    Chief Complaint  Patient presents with  . Urinary Frequency  . Dental Pain      The history is provided by the patient. No language interpreter was used.    HPI Comments: Abigail Hardin is a 24 y.o. female who presents to the Emergency Department complaining of urinary frequency onset 1 week. She states that she is having associated symptoms of bladder pressure, right lower dental pain, gum swelling, dental abscess, malodorous urine, nausea. She states that she has tried baking soda and hydrogen peroxide with no relief for her symptoms. She denies dysuria, vaginal bleeding, vaginal discharge, and any other symptoms. Denies taking blood thinners. Pt denies having a dentist at this time.   Past Medical History  Diagnosis Date  . Asthma     "I guess I grew out of it."   Past Surgical History  Procedure Laterality Date  . Dilation and curettage of uterus    . Induced abortion  04/09/2011   Family History  Problem Relation Age of Onset  . Asthma Daughter   . Asthma Son   . Hypertension Maternal Aunt    History  Substance Use Topics  . Smoking status: Current Every Day Smoker -- 0.25 packs/day for 18 years    Types: Cigarettes  . Smokeless tobacco: Not on file  . Alcohol Use: Yes   OB History    Gravida Para Term Preterm AB TAB SAB Ectopic Multiple Living   Review of Systems  Constitutional: Positive for chills. Negative for fever.  HENT: Positive for dental problem.        Dental abscess  Gastrointestinal: Positive for nausea. Negative for abdominal pain.  Genitourinary: Positive for frequency. Negative for hematuria.  All other systems reviewed and are negative.     Allergies  Review of patient's allergies indicates  no known allergies.  Home Medications   Prior to Admission medications   Medication Sig Start Date End Date Taking? Authorizing Provider  diphenhydrAMINE (BENADRYL) 12.5 MG/5ML elixir Take 25 mg by mouth at bedtime as needed for sleep.    Historical Provider, MD  HYDROcodone-acetaminophen (NORCO/VICODIN) 5-325 MG per tablet Take 1-2 tablets by mouth every 6 (six) hours as needed for pain. 01/24/13   Heather Laisure, PA-C  methocarbamol (ROBAXIN) 500 MG tablet Take 1 tablet (500 mg total) by mouth 2 (two) times daily. 01/24/13   Heather Laisure, PA-C  naproxen (NAPROSYN) 500 MG tablet Take 1 tablet (500 mg total) by mouth 2 (two) times daily. 01/24/13   Heather Laisure, PA-C   BP 141/89 mmHg  Pulse 85  Temp(Src) 99.7 F (37.6 C) (Oral)  Resp 14  Ht  (1.651 m)  Wt 179 lb (81.194 kg)  BMI 29.79 kg/m2  SpO2 100% Physical Exam  Constitutional: She is oriented to person, place, and time. She appears well-developed and well-nourished. No distress.  Awake, alert, nontoxic appearance.  HENT:  Head: Normocephalic and atraumatic.  Mouth/Throat: Oropharynx is clear and moist and mucous membranes are normal. No trismus in the jaw.  Mild gingival swelling at right mandibular canines and premolars with area of tenderness and possible abscess. No glossal elevation. Tolerated secretions well with  a patent airway  Eyes: EOM are normal. Right eye exhibits no discharge. Left eye exhibits no discharge.  Neck: Neck supple. No tracheal deviation present.  Cardiovascular: Normal rate, regular rhythm and normal heart sounds.  Exam reveals no gallop and no friction rub.   No murmur heard. Pulmonary/Chest: Effort normal. No respiratory distress. She exhibits no tenderness.  Abdominal: Soft. There is no rebound.  Very mild tenderness with deep palpation over suprapubic region. Abdominal exam otherwise benign.  Musculoskeletal: Normal range of motion. She exhibits no tenderness.  Baseline ROM, no obvious new  focal weakness.  Neurological: She is alert and oriented to person, place, and time.  Mental status and motor strength appears baseline for patient and situation.  Skin: Skin is warm and dry. No rash noted.  Psychiatric: She has a normal mood and affect. Her behavior is normal.  Nursing note and vitals reviewed.   ED Course  Procedures (including critical care time) NERVE BLOCK Performed by: Sharlene Mottsartner, Pleasant Britz W Consent: Verbal consent obtained. Required items: required blood products, implants, devices, and special equipment available Time out: Immediately prior to procedure a "time out" was called to verify the correct patient, procedure, equipment, support staff and site/side marked as required.  Indication: Dental pain  Nerve block body site: Inferior alveolar   Preparation: Patient was prepped and draped in the usual sterile fashion. Needle gauge: 24 G Location technique: anatomical landmarks  Local anesthetic: Bupivacaine   Anesthetic total: 1.8 ml  Outcome: pain improved Patient tolerance: Patient tolerated the procedure well with no immediate complications.  INCISION AND DRAINAGE Performed by: Sharlene Mottsartner, Evian Derringer W Consent: Verbal consent obtained. Risks and benefits: risks, benefits and alternatives were discussed Type: abscess  Body area: Mouth  Anesthesia: local infiltration  Incision was made with a scalpel.  Local anesthetic: Bupivacaine   Anesthetic total: 1.8 ml  Complexity: complex Blunt dissection to break up loculations  Drainage: purulent  Drainage amount: Scant   Packing material:   Patient tolerance: Patient tolerated the procedure well with no immediate complications.     DIAGNOSTIC STUDIES: Oxygen Saturation is 100% on RA, nl by my interpretation.    COORDINATION OF CARE: 9:45 PM-Discussed treatment plan with pt at bedside and pt agreed to plan.   Labs Review Labs Reviewed  URINALYSIS, ROUTINE W REF[facility]COPIC [facility])   URINALYSIS, ROUTINE W REFLEX [facility]COPIC [facility])  POC URINE PREG, ED    Imaging Review No results found.   EKG Interpretation None     Meds given in ED:  Medications  HYDROcodone-acetaminophen (NORCO/VICODIN) 5-325 MG per tablet 1 tablet (1 tablet Oral Given 12/21/14 2218)    Discharge Medication List as of 12/21/2014 11:00 PM    START taking these medications   Details  penicillin v potassium (VEETID) 500 MG tablet Take 1 tablet (500 mg total) by mouth 4 (four) times daily., Starting 12/21/2014, Until Thu 12/28/14, Print       Filed Vitals:   12/21/14 2130 12/21/14 2302  BP: 141/89 119/76  Pulse: 85 94  Temp: 99.7 F (37.6 C) 100.6 F (38.1 C)  TempSrc: Oral Oral  Resp: 14 22  Height: 5\' 5"  (1.651 m)   Weight: 179 lb (81.194 kg)   SpO2: 100% 99%    MDM  Vitals stable - WNL, febrile to 100.6 Pt resting comfortably in ED. some relief after nerve block. PE--physical exam not concerning, mild discomfort noted over suprapubic region with deep palpation. No evidence of low exam, retropharyngeal or peritonsillar  abscess or other emergent intraoral infection. No trismus or glossal elevation. Uvula midline, patent airway. Labwork-no evidence of UTI on urinalysis.  DDX--patient given outpatient dentistry resources, antibiotics, strict return precautions. Mild fever likely secondary to potential underlying dental infection. Abdominal exam is not concerning, however strict return precautions given. No evidence of other systemic infection.  I discussed all relevant lab findings and imaging results with pt and they verbalized understanding. Discussed f/u with PCP within 48 hrs and return precautions, pt very amenable to plan.  Final diagnoses:  Pain, dental  Dysuria   I personally performed the services described in this documentation, which was scribed in my presence. The recorded information has been reviewed and is accurate.    Abigail Peek, PA-C 12/21/14  2322  Mirian Mo, MD 12/22/14 [phone]

## 2014-12-21 NOTE — ED Notes (Signed)
Pt. reports urinary frequency/bladder pressure onset this week and right lower molar pain with swelling .

## 2014-12-21 NOTE — Discharge Instructions (Signed)
Is important for you to follow-up with dentistry for definitive dental care. Take your antibiotic's as directed. Your labs did not show any evidence of UTI or other infection. Return to ED for new or worsening symptoms.  Dental Pain A tooth ache may be caused by cavities (tooth decay). Cavities expose the nerve of the tooth to air and hot or cold temperatures. It may come from an infection or abscess (also called a boil or furuncle) around your tooth. It is also often caused by dental caries (tooth decay). This causes the pain you are having. DIAGNOSIS  Your caregiver can diagnose this problem by exam. TREATMENT   If caused by an infection, it may be treated with medications which kill germs (antibiotics) and pain medications as prescribed by your caregiver. Take medications as directed.  Only take over-the-counter or prescription medicines for pain, discomfort, or fever as directed by your caregiver.  Whether the tooth ache today is caused by infection or dental disease, you should see your dentist as soon as possible for further care. SEEK MEDICAL CARE IF: The exam and treatment you received today has been provided on an emergency basis only. This is not a substitute for complete medical or dental care. If your problem worsens or new problems (symptoms) appear, and you are unable to meet with your dentist, call or return to this location. SEEK IMMEDIATE MEDICAL CARE IF:   You have a fever.  You develop redness and swelling of your face, jaw, or neck.  You are unable to open your mouth.  You have severe pain uncontrolled by pain medicine. MAKE SURE YOU:   Understand these instructions.  Will watch your condition.  Will get help right away if you are not doing well or get worse. Document Released: 05/26/2005 Document Revised: 08/18/2011 Document Reviewed: 01/12/2008 [facility] Patient Information 2015 [employer], [place]. This information is not intended to replace advice given to you by  your health care provider. Make sure you discuss any questions you have with your health care provider.

## 2014-12-22 LAB — URINALYSIS, ROUTINE W REFLEX MICROSCOPIC
BILIRUBIN URINE: NEGATIVE
Glucose, UA: NEGATIVE mg/dL
Hgb urine dipstick: NEGATIVE
Ketones, ur: NEGATIVE mg/dL
LEUKOCYTES UA: NEGATIVE
NITRITE: NEGATIVE
Protein, ur: NEGATIVE mg/dL
Specific Gravity, Urine: 1.01 (ref 1.005–1.030)
Urobilinogen, UA: 1 mg/dL (ref 0.0–1.0)
pH: 7.5 (ref 5.0–8.0)

## 2015-05-04 ENCOUNTER — Emergency Department (HOSPITAL_COMMUNITY): Payer: Self-pay

## 2015-05-04 ENCOUNTER — Emergency Department (HOSPITAL_COMMUNITY)
Admission: EM | Admit: 2015-05-04 | Discharge: 2015-05-04 | Disposition: A | Discharge status: Home / Self Care | Payer: Self-pay | Attending: Emergency Medicine | Admitting: Emergency Medicine

## 2015-05-04 ENCOUNTER — Encounter (HOSPITAL_COMMUNITY): Payer: Self-pay | Admitting: Emergency Medicine

## 2015-05-04 DIAGNOSIS — Y92481 Parking lot as the place of occurrence of the external cause: Secondary | ICD-10-CM | POA: Insufficient documentation

## 2015-05-04 DIAGNOSIS — F17210 Nicotine dependence, cigarettes, uncomplicated: Secondary | ICD-10-CM | POA: Insufficient documentation

## 2015-05-04 DIAGNOSIS — Z3202 Encounter for pregnancy test, result negative: Secondary | ICD-10-CM | POA: Insufficient documentation

## 2015-05-04 DIAGNOSIS — S199XXA Unspecified injury of neck, initial encounter: Secondary | ICD-10-CM | POA: Insufficient documentation

## 2015-05-04 DIAGNOSIS — Z791 Long term (current) use of non-steroidal anti-inflammatories (NSAID): Secondary | ICD-10-CM | POA: Insufficient documentation

## 2015-05-04 DIAGNOSIS — S3992XA Unspecified injury of lower back, initial encounter: Secondary | ICD-10-CM | POA: Insufficient documentation

## 2015-05-04 DIAGNOSIS — Y9389 Activity, other specified: Secondary | ICD-10-CM | POA: Insufficient documentation

## 2015-05-04 DIAGNOSIS — S3993XA Unspecified injury of pelvis, initial encounter: Secondary | ICD-10-CM | POA: Insufficient documentation

## 2015-05-04 DIAGNOSIS — Z79899 Other long term (current) drug therapy: Secondary | ICD-10-CM | POA: Insufficient documentation

## 2015-05-04 DIAGNOSIS — Y998 Other external cause status: Secondary | ICD-10-CM | POA: Insufficient documentation

## 2015-05-04 LAB — URINALYSIS, ROUTINE W REFLEX MICROSCOPIC
Bilirubin Urine: NEGATIVE
GLUCOSE, UA: NEGATIVE mg/dL
HGB URINE DIPSTICK: NEGATIVE
KETONES UR: NEGATIVE mg/dL
Leukocytes, UA: NEGATIVE
Nitrite: NEGATIVE
PH: 6.5 (ref 5.0–8.0)
Protein, ur: NEGATIVE mg/dL
Specific Gravity, Urine: 1.015 (ref 1.005–1.030)

## 2015-05-04 LAB — POC URINE PREG, ED: Preg Test, Ur: NEGATIVE

## 2015-05-04 MED ORDER — IBUPROFEN 800 MG PO TABS: 800.0000 mg | ORAL_TABLET | Freq: Three times a day (TID) | ORAL | Status: DC

## 2015-05-04 MED ORDER — IBUPROFEN 800 MG PO TABS
800.0000 mg | ORAL_TABLET | Freq: Once | ORAL | Status: AC
  Administered 2015-05-04: 800 mg via ORAL
  Filled 2015-05-04: qty 1

## 2015-05-04 MED ORDER — IBUPROFEN 400 MG PO TABS
800.0000 mg | ORAL_TABLET | Freq: Once | ORAL | Status: AC
  Administered 2015-05-04: 800 mg via ORAL
  Filled 2015-05-04: qty 2

## 2015-05-04 MED ORDER — CYCLOBENZAPRINE HCL 5 MG PO TABS: 5.0000 mg | ORAL_TABLET | Freq: Three times a day (TID) | ORAL | Status: DC | PRN
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
## 2015-05-04 NOTE — ED Notes (Signed)
Pt ambulating independently w/ steady gait on d/c in no acute distress, A&Ox4. D/c instructions reviewed w/ pt and family - pt and family deny any further questions or concerns at present. Rx given x2  
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
## 2016-12-22 ENCOUNTER — Inpatient Hospital Stay (HOSPITAL_COMMUNITY)
Admission: AD | Admit: 2016-12-22 | Discharge: 2016-12-22 | Disposition: A | Discharge status: Home / Self Care | Payer: Self-pay | Source: Ambulatory Visit | Attending: Obstetrics & Gynecology | Admitting: Obstetrics & Gynecology

## 2016-12-22 ENCOUNTER — Encounter (HOSPITAL_COMMUNITY): Payer: Self-pay | Admitting: *Deleted

## 2016-12-22 DIAGNOSIS — Z113 Encounter for screening for infections with a predominantly sexual mode of transmission: Secondary | ICD-10-CM

## 2016-12-22 DIAGNOSIS — K5900 Constipation, unspecified: Secondary | ICD-10-CM

## 2016-12-22 DIAGNOSIS — Z3202 Encounter for pregnancy test, result negative: Secondary | ICD-10-CM

## 2016-12-22 DIAGNOSIS — N898 Other specified noninflammatory disorders of vagina: Secondary | ICD-10-CM

## 2016-12-22 DIAGNOSIS — F17210 Nicotine dependence, cigarettes, uncomplicated: Secondary | ICD-10-CM | POA: Insufficient documentation

## 2016-12-22 DIAGNOSIS — R1032 Left lower quadrant pain: Secondary | ICD-10-CM | POA: Insufficient documentation

## 2016-12-22 LAB — WET PREP, GENITAL
Clue Cells Wet Prep HPF POC: NONE SEEN
Sperm: NONE SEEN
Trich, Wet Prep: NONE SEEN
YEAST WET PREP: NONE SEEN

## 2016-12-22 LAB — URINALYSIS, ROUTINE W REFLEX MICROSCOPIC
Bilirubin Urine: NEGATIVE
Glucose, UA: NEGATIVE mg/dL
Ketones, ur: NEGATIVE mg/dL
NITRITE: NEGATIVE
PROTEIN: NEGATIVE mg/dL
Specific Gravity, Urine: 1.019 (ref 1.005–1.030)
pH: 5 (ref 5.0–8.0)

## 2016-12-22 LAB — POCT PREGNANCY, URINE: Preg Test, Ur: NEGATIVE

## 2016-12-22 NOTE — MAU Note (Signed)
Feeling a lot of pressure in LLQ, started about 4 days ago.  Period is late.  Having pain in back, initially low back,now all the way up.  Feeling a little fatigued.  Urine is pale and clear, but has a strong odor.  Denies diarrhea, Has been constipated

## 2016-12-22 NOTE — MAU Note (Signed)
Pt. Left without signing discharge instructions.
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
## 2016-12-23 LAB — GC/CHLAMYDIA PROBE AMP (~~LOC~~) NOT AT ARMC
Chlamydia: NEGATIVE
NEISSERIA GONORRHEA: NEGATIVE

## 2016-12-29 ENCOUNTER — Emergency Department (HOSPITAL_COMMUNITY): Payer: Self-pay

## 2016-12-29 ENCOUNTER — Ambulatory Visit (HOSPITAL_COMMUNITY)
Admission: EM | Admit: 2016-12-29 | Discharge: 2016-12-29 | Disposition: A | Discharge status: Home / Self Care | Payer: No Typology Code available for payment source | Attending: Emergency Medicine | Admitting: Emergency Medicine

## 2016-12-29 ENCOUNTER — Emergency Department (HOSPITAL_COMMUNITY)
Admission: EM | Admit: 2016-12-29 | Discharge: 2016-12-29 | Disposition: A | Discharge status: Home / Self Care | Payer: Self-pay | Attending: Emergency Medicine | Admitting: Emergency Medicine

## 2016-12-29 ENCOUNTER — Encounter (HOSPITAL_COMMUNITY): Payer: Self-pay | Admitting: *Deleted

## 2016-12-29 DIAGNOSIS — R519 Headache, unspecified: Secondary | ICD-10-CM

## 2016-12-29 DIAGNOSIS — R51 Headache: Secondary | ICD-10-CM | POA: Insufficient documentation

## 2016-12-29 DIAGNOSIS — F17210 Nicotine dependence, cigarettes, uncomplicated: Secondary | ICD-10-CM | POA: Insufficient documentation

## 2016-12-29 DIAGNOSIS — Z0441 Encounter for examination and observation following alleged adult rape: Secondary | ICD-10-CM | POA: Diagnosis not present

## 2016-12-29 DIAGNOSIS — M542 Cervicalgia: Secondary | ICD-10-CM | POA: Insufficient documentation

## 2016-12-29 LAB — COMPREHENSIVE METABOLIC PANEL
ALBUMIN: 4.3 g/dL (ref 3.5–5.0)
ALT: 16 U/L (ref 14–54)
ANION GAP: 11 (ref 5–15)
AST: 19 U/L (ref 15–41)
Alkaline Phosphatase: 110 U/L (ref 38–126)
BUN: 9 mg/dL (ref 6–20)
CHLORIDE: 104 mmol/L (ref 101–111)
CO2: 22 mmol/L (ref 22–32)
Calcium: 9.4 mg/dL (ref 8.9–10.3)
Creatinine, Ser: 0.79 mg/dL (ref 0.44–1.00)
GFR calc non Af Amer: >60 mL/min (ref >60)
GLUCOSE: 108 mg/dL — AB (ref 65–99)
Potassium: 3.3 mmol/L — ABNORMAL LOW (ref 3.5–5.1)
Sodium: 137 mmol/L (ref 135–145)
Total Bilirubin: 1.4 mg/dL — ABNORMAL HIGH (ref 0.3–1.2)
Total Protein: 7.6 g/dL (ref 6.5–8.1)

## 2016-12-29 LAB — RAPID HIV SCREEN (HIV 1/2 AB+AG)
HIV 1/2 Antibodies: NONREACTIVE
HIV-1 P24 Antigen - HIV24: NONREACTIVE

## 2016-12-29 LAB — POC URINE PREG, ED: Preg Test, Ur: NEGATIVE

## 2016-12-29 MED ORDER — ELVITEG-COBIC-EMTRICIT-TENOFAF 150-150-200-10 MG PO TABS: 1.0000 | ORAL_TABLET | Freq: Every day | ORAL | 0 refills | Status: DC

## 2016-12-29 MED ORDER — PROMETHAZINE HCL 25 MG PO TABS
25.0000 mg | ORAL_TABLET | Freq: Four times a day (QID) | ORAL | Status: DC | PRN
  Administered 2016-12-29: 75 mg via ORAL

## 2016-12-29 MED ORDER — METRONIDAZOLE 500 MG PO TABS
2000.0000 mg | ORAL_TABLET | Freq: Once | ORAL | Status: AC
  Administered 2016-12-29: 2000 mg via ORAL

## 2016-12-29 MED ORDER — AZITHROMYCIN 250 MG PO TABS
1000.0000 mg | ORAL_TABLET | Freq: Once | ORAL | Status: AC
  Administered 2016-12-29: 1000 mg via ORAL

## 2016-12-29 MED ORDER — ULIPRISTAL ACETATE 30 MG PO TABS
30.0000 mg | ORAL_TABLET | Freq: Once | ORAL | Status: AC
  Administered 2016-12-29: 30 mg via ORAL
  Filled 2016-12-29: qty 1

## 2016-12-29 MED ORDER — CEFTRIAXONE SODIUM 250 MG IJ SOLR
250.0000 mg | Freq: Once | INTRAMUSCULAR | Status: AC
  Administered 2016-12-29: 250 mg via INTRAMUSCULAR

## 2016-12-29 MED ORDER — LIDOCAINE HCL (PF) 1 % IJ SOLN
0.9000 mL | Freq: Once | INTRAMUSCULAR | Status: AC
  Administered 2016-12-29: 0.9 mL

## 2016-12-29 NOTE — SANE Note (Signed)
Genvoya 5 day supply given. Did not populate MAR. [facility] Did not populate. Will follow up tomorrow.
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
## 2016-12-29 NOTE — SANE Note (Signed)
Follow-up Phone Call  Patient gives verbal consent for a FNE/SANE follow-up phone call in 48-72 hours: yes Patient's telephone number: [phone], but pt is changing and will follow up Patient gives verbal consent to leave voicemail at the phone number listed above: yes DO NOT CALL between the hours of: did not state

## 2016-12-29 NOTE — ED Notes (Signed)
EDP at bedside talking with patient. Pt. Son sitting at nurses station with RN.

## 2016-12-29 NOTE — Discharge Instructions (Signed)
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 children. Special care may be needed. This medicine should not be given to infants and children younger than 71 years old. Overdosage: If you think you have taken too much of this medicine contact a poison control center or emergency room at once. NOTE: This medicine is only for you. Do not share this medicine with others. What if I miss a dose? If you miss a dose, take it as soon as you can. If it is almost time for your next dose, take only that dose. Do not take double or extra doses. What may interact with this medicine? Do not take this medicine with any of the following medications: -cisapride -dofetilide -dronedarone -MAOIs like Carbex, Eldepryl, Marplan, Nardil, Parnate -pimozide -quinidine, including dextromethorphan; quinidine -thioridazine -ziprasidone This medicine may also interact with the following medications: -certain medicines for depression, anxiety, or psychotic  disturbances -certain medicines for anxiety or sleep -certain medicines for seizures like carbamazepine, phenobarbital, phenytoin -certain medicines for movement abnormalities as in Parkinson's disease, or for gastrointestinal problems -epinephrine -medicines for allergies or colds -muscle relaxants -narcotic medicines for pain -other medicines that prolong the QT interval (cause an abnormal heart rhythm) -tramadol -trimethobenzamide This list may not describe all possible interactions. Give your health care provider a list of all the medicines, herbs, non-prescription drugs, or dietary supplements you use. Also tell them if you smoke, drink alcohol, or use illegal drugs. Some items may interact with your medicine. What should I watch for while using this medicine? Tell your doctor or health care professional if your symptoms do not start to get better in 1 to 2 days. You may get drowsy or dizzy. Do not drive, use machinery, or do anything that needs mental alertness until you know how this medicine affects you. To reduce the risk of dizzy or fainting spells, do not stand or sit up quickly, especially if you are an older patient. Alcohol may increase dizziness and drowsiness. Avoid alcoholic drinks. Your mouth may get dry. Chewing sugarless gum or sucking hard candy, and drinking plenty of water may help. Contact your doctor if the problem does not go away or is severe. This medicine may cause dry eyes and blurred vision. If you wear contact lenses you may feel some discomfort. Lubricating drops may help. See your eye doctor if the problem does not go away or is severe. This medicine can make you more sensitive to the sun. Keep out of the sun. If you cannot avoid being in the sun, wear protective clothing and use sunscreen. Do not use sun lamps or tanning beds/booths. If you are diabetic, check your blood-sugar levels regularly. What side effects may I notice from receiving this medicine? Side  effects that you should report to your doctor or health care professional as soon as possible: -blurred vision -irregular heartbeat, palpitations or chest pain -muscle or facial twitches -pain or difficulty passing urine -seizures -skin rash -slowed or shallow breathing -unusual bleeding or bruising -yellowing of the eyes or skin Side effects that usually do not require medical attention (report to your doctor or health care professional if they continue or are bothersome): -headache -nightmares, agitation, nervousness, excitability, not able to sleep (these are more likely in children) -stuffy nose This list may not describe all possible side effects. Call your doctor for medical advice about side effects. You may report side effects to FDA at 1-800-FDA-1088. Where should I keep my medicine? Keep out of the reach of children. Store at room temperature, between 20 and 25  degrees C (68 and 77 degrees F). Protect from light. Throw away any unused medicine after the expiration date. NOTE: This sheet is a summary. It may not cover all possible information. If you have questions about this medicine, talk to your doctor, pharmacist, or health care provider.  2017 Elsevier/Gold Standard (2013-01-25 15:04:46)   Ceftriaxone (Injection/Shot) Also known as:  Rocephin  Ceftriaxone injection What is this medicine? CEFTRIAXONE (sef try AX one) is a cephalosporin antibiotic. It is used to treat certain kinds of bacterial infections. It will not work for colds, flu, or other viral infections. This medicine may be used for other purposes; ask your health care provider or pharmacist if you have questions. COMMON BRAND NAME(S): Rocephin What should I tell my health care provider before I take this medicine? They need to know if you have any of these conditions: -any chronic illness -bowel disease, like colitis -both kidney and liver disease -high bilirubin level in newborn patients -an unusual or  allergic reaction to ceftriaxone, other cephalosporin or penicillin antibiotics, foods, dyes, or preservatives -pregnant or trying to get pregnant -breast-feeding How should I use this medicine? This medicine is injected into a muscle or infused it into a vein. It is usually given in a medical office or clinic. If you are to give this medicine you will be taught how to inject it. Follow instructions carefully. Use your doses at regular intervals. Do not take your medicine more often than directed. Do not skip doses or stop your medicine early even if you feel better. Do not stop taking except on your doctor's advice. Talk to your pediatrician regarding the use of this medicine in children. Special care may be needed. Overdosage: If you think you have taken too much of this medicine contact a poison control center or emergency room at once. NOTE: This medicine is only for you. Do not share this medicine with others. What if I miss a dose? If you miss a dose, take it as soon as you can. If it is almost time for your next dose, take only that dose. Do not take double or extra doses. What may interact with this medicine? Do not take this medicine with any of the following medications: -intravenous calcium This medicine may also interact with the following medications: -birth control pills This list may not describe all possible interactions. Give your health care provider a list of all the medicines, herbs, non-prescription drugs, or dietary supplements you use. Also tell them if you smoke, drink alcohol, or use illegal drugs. Some items may interact with your medicine. What should I watch for while using this medicine? Tell your doctor or health care professional if your symptoms do not improve or if they get worse. Do not treat diarrhea with over the counter products. Contact your doctor if you have diarrhea that lasts more than 2 days or if it is severe and watery. If you are being treated for a  sexually transmitted disease, avoid sexual contact until you have finished your treatment. Having sex can infect your sexual partner. Calcium may bind to this medicine and cause lung or kidney problems. Avoid calcium products while taking this medicine and for 48 hours after taking the last dose of this medicine. What side effects may I notice from receiving this medicine? Side effects that you should report to your doctor or health care professional as soon as possible: -allergic reactions like skin rash, itching or hives, swelling of the face, lips, or tongue -breathing problems -  fever, chills -irregular heartbeat -pain when passing urine -seizures -stomach pain, cramps -unusual bleeding, bruising -unusually weak or tired Side effects that usually do not require medical attention (report to your doctor or health care professional if they continue or are bothersome): -diarrhea -dizzy, drowsy -headache -nausea, vomiting -pain, swelling, irritation where injected -stomach upset -sweating This list may not describe all possible side effects. Call your doctor for medical advice about side effects. You may report side effects to FDA at 1-800-FDA-1088. Where should I keep my medicine? Keep out of the reach of children. Store at room temperature below 25 degrees C (77 degrees F). Protect from light. Throw away any unused vials after the expiration date. NOTE: This sheet is a summary. It may not cover all possible information. If you have questions about this medicine, talk to your doctor, pharmacist, or health care provider.  2017 Elsevier/Gold Standard (2013-12-12 09:14:54)       Cobicistat; Elvitegravir; Emtricitabine; Tenofovir Alafenamide oral tablets   What is this medicine? COBICISTAT; ELVITEGRAVIR; EMTRICITABINE; TENOFOVIR ALAFENAMIDE (koe BIS i stat; el vye TEG ra veer; em tri SIT uh bean; te NOE fo veer) is three antiretroviral medicines and a medication booster in one  tablet. It is used to treat HIV. This medicine is not a cure for HIV. It will not stop the spread of HIV to others. This medicine may be used for other purposes; ask your health care provider or pharmacist if you have questions. COMMON BRAND NAME(S): Genvoya  What should I tell my health care provider before I take this medicine? They need to know if you have any of these conditions: -kidney disease -liver disease -an unusual or allergic reaction to cobicistat, elvitegravir, emtricitabine, tenofovir, other medicines, foods, dyes, or preservatives -pregnant or trying to get pregnant -breast-feeding  How should I use this medicine? Take this medicine by mouth with a glass of water. Follow the directions on the prescription label. Take this medicine with food. Take your medicine at regular intervals. Do not take your medicine more often than directed. For your anti-HIV therapy to work as well as possible, take each dose exactly as prescribed. Do not skip doses or stop your medicine even if you feel better. Skipping doses may make the HIV virus resistant to this medicine and other medicines. Do not stop taking except on your doctor's advice. Talk to your pediatrician regarding the use of this medicine in children. While this drug may be prescribed for selected conditions, precautions do apply. Overdosage: If you think you have taken too much of this medicine contact a poison control center or emergency room at once. NOTE: This medicine is only for you. Do not share this medicine with others.  What if I miss a dose? If you miss a dose, take it as soon as you can. If it is almost time for your next dose, take only that dose. Do not take double or extra doses.  What may interact with this medicine? Do not take this medicine with any of the following medications: -adefovir -alfuzosin -certain medicines for seizures like carbamazepine, phenobarbital, phenytoin -cisapride -lumacaftor;  ivacaftor -lurasidone -medicines for cholesterol like lovastatin, simvastatin -medicines for headaches like dihydroergotamine, ergotamine, methylergonovine -midazolam -other antiviral medicines for HIV or AIDS -pimozide -rifampin -sildenafil -St. John's wort -triazolam This medicine may also interact with the following medications: -antacids -atorvastatin -bosentan -buprenorphine; naloxone -certain antibiotics like clarithromycin, telithromycin, rifabutin, rifapentine -certain medications for anxiety or sleep like buspirone, clorazepate, diazepam, estazolam, flurazepam, zolpidem -certain medicines for  blood pressure or heart disease like amlodipine, diltiazem, felodipine, metoprolol, nicardipine, nifedipine, timolol, verapamil -certain medicines for depression, anxiety, or psychiatric disturbances -certain medicines for erectile dysfunction like avanafil, sildenafil, tadalafil, vardenafil -certain medicines for fungal infection like itraconazole, ketoconazole, voriconazole -colchicine -cyclosporine -dexamethasone -female hormones, like estrogens and progestins and birth control pills -fluticasone -medicines for infection like acyclovir, cidofovir, valacyclovir, ganciclovir, valganciclovir -medicines for irregular heart beat like amiodarone, bepridil, digoxin, disopyramide, dofetilide, flecainide, lidocaine, mexiletine, propafenone, quinidine -metformin -oxcarbazepine -phenothiazines like perphenazine, risperidone, thioridazine -salmeterol -sirolimus -tacrolimus -warfarin This list may not describe all possible interactions. Give your health care provider a list of all the medicines, herbs, non-prescription drugs, or dietary supplements you use. Also tell them if you smoke, drink alcohol, or use illegal drugs. Some items may interact with your medicine.  What should I watch for while using this medicine? Visit your doctor or health care professional for regular check ups.  Discuss any new symptoms with your doctor. You will need to have important blood work done while on this medicine. HIV is spread to others through sexual or blood contact. Talk to your doctor about how to stop the spread of HIV. If you have hepatitis B, talk to your doctor if you plan to stop this medicine. The symptoms of hepatitis B may get worse if you stop this medicine. Birth control pills may not work properly while you are taking this medicine. Talk to your doctor about using an extra method of birth control. Women who can still have children must use a reliable form of barrier contraception, like a condom.  What side effects may I notice from receiving this medicine? Side effects that you should report to your doctor or health care professional as soon as possible: -allergic reactions like skin rash, itching or hives, swelling of the face, lips, or tongue -breathing problems -fast, irregular heartbeat -muscle pain or weakness -signs and symptoms of kidney injury like trouble passing urine or change in the amount of urine -signs and symptoms of liver injury like dark yellow or brown urine; general ill feeling or flu-like symptoms; light-colored stools; loss of appetite; right upper belly pain; unusually weak or tired; yellowing of the eyes or skin Side effects that usually do not require medical attention (report to your doctor or health care professional if they continue or are bothersome): -diarrhea -headache -nausea -tiredness This list may not describe all possible side effects. Call your doctor for medical advice about side effects. You may report side effects to FDA at 1-800-FDA-1088.  Where should I keep my medicine? Keep out of the reach of children. Store at room temperature below 30 degrees C (86 degrees F). Throw away any unused medicine after the expiration date. NOTE: This sheet is a summary. It may not cover all possible information. If you have questions about this  medicine, talk to your doctor, pharmacist, or health care provider.  2018 Elsevier/Gold Standard (2016-03-10 12:54:04)

## 2016-12-29 NOTE — ED Notes (Signed)
Called for triage x1.

## 2016-12-29 NOTE — ED Notes (Signed)
Pt. Talking with advocate at bedside.
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
## 2016-12-30 LAB — HEPATITIS B SURFACE ANTIGEN: Hepatitis B Surface Ag: NEGATIVE

## 2016-12-30 LAB — HEPATITIS C ANTIBODY: HCV Ab: <0.1 s/co ratio (ref 0.0–0.9)

## 2016-12-30 LAB — RPR: RPR: NONREACTIVE

## 2016-12-30 MED ORDER — ELVITEG-COBIC-EMTRICIT-TENOFAF 150-150-200-10 MG PO TABS: 1.0000 | ORAL_TABLET | Freq: Every day | ORAL | 0 refills | Status: DC

## 2016-12-30 NOTE — SANE Note (Signed)
Contacted [facility] to determine if escribe for patient's 23 days of genvoya had gone through.  They still have not received the prescription.  While I was on phone with them, called in medication under my name, to ensure patient received medication in a timely manner.  Patient is to pick medication up today.

## 2016-12-31 NOTE — SANE Note (Signed)
  On 12/30/2016 @ approx. 14:00, I spoke with Daphanie at [facility] and provided her with the following approval numbers, for [facility], from the Advancing Access Program:  ID- [id number] BIN- [id number] Group- [id number] PCN- [id number]

## 2017-01-01 NOTE — ED Provider Notes (Signed)
Medical screening examination/treatment/procedure(s) were performed by non-physician practitioner and as supervising physician I was immediately available for consultation/collaboration.     Linwood DibblesKnapp, Parke Jandreau, MD 01/01/17 [phone]

## 2017-01-02 NOTE — Hospital Discharge Follow-Up (Signed)
ON 01/02/2017, AT APPROXIMATELY 1350 HOURS, CONTACT WITH THE PATIENT WAS ATTEMPTED, VIA THE TELEPHONE NUMBER IN THE CHART [phone]).  THE NUMBER HAS BEEN DISCONNECTED.

## 2017-07-02 ENCOUNTER — Inpatient Hospital Stay (HOSPITAL_COMMUNITY)
Admission: AD | Admit: 2017-07-02 | Discharge: 2017-07-02 | Disposition: A | Discharge status: Home / Self Care | Payer: Self-pay | Source: Ambulatory Visit | Attending: Obstetrics and Gynecology | Admitting: Obstetrics and Gynecology

## 2017-07-02 ENCOUNTER — Inpatient Hospital Stay (HOSPITAL_COMMUNITY): Payer: Self-pay

## 2017-07-02 ENCOUNTER — Encounter (HOSPITAL_COMMUNITY): Payer: Self-pay | Admitting: *Deleted

## 2017-07-02 DIAGNOSIS — J069 Acute upper respiratory infection, unspecified: Secondary | ICD-10-CM | POA: Insufficient documentation

## 2017-07-02 DIAGNOSIS — R0602 Shortness of breath: Secondary | ICD-10-CM

## 2017-07-02 DIAGNOSIS — F17210 Nicotine dependence, cigarettes, uncomplicated: Secondary | ICD-10-CM | POA: Insufficient documentation

## 2017-07-02 DIAGNOSIS — B9789 Other viral agents as the cause of diseases classified elsewhere: Secondary | ICD-10-CM

## 2017-07-02 DIAGNOSIS — Z3202 Encounter for pregnancy test, result negative: Secondary | ICD-10-CM | POA: Insufficient documentation

## 2017-07-02 LAB — COMPREHENSIVE METABOLIC PANEL
ALK PHOS: 102 U/L (ref 38–126)
ALT: 12 U/L — ABNORMAL LOW (ref 14–54)
ANION GAP: 7 (ref 5–15)
AST: 15 U/L (ref 15–41)
Albumin: 3.7 g/dL (ref 3.5–5.0)
BILIRUBIN TOTAL: 0.7 mg/dL (ref 0.3–1.2)
BUN: 10 mg/dL (ref 6–20)
CALCIUM: 8.7 mg/dL — AB (ref 8.9–10.3)
CO2: 26 mmol/L (ref 22–32)
Chloride: 102 mmol/L (ref 101–111)
Creatinine, Ser: 0.85 mg/dL (ref 0.44–1.00)
Glucose, Bld: 92 mg/dL (ref 65–99)
POTASSIUM: 3.8 mmol/L (ref 3.5–5.1)
Sodium: 135 mmol/L (ref 135–145)
TOTAL PROTEIN: 7 g/dL (ref 6.5–8.1)

## 2017-07-02 LAB — CBC WITH DIFFERENTIAL/PLATELET
Basophils Absolute: 0 10*3/uL (ref 0.0–0.1)
Basophils Relative: 1 %
Eosinophils Absolute: 0.2 10*3/uL (ref 0.0–0.7)
Eosinophils Relative: 3 %
HEMATOCRIT: 40.2 % (ref 36.0–46.0)
HEMOGLOBIN: 14 g/dL (ref 12.0–15.0)
LYMPHS ABS: 3.1 10*3/uL (ref 0.7–4.0)
Lymphocytes Relative: 48 %
MCH: 31.5 pg (ref 26.0–34.0)
MCHC: 34.8 g/dL (ref 30.0–36.0)
MCV: 90.3 fL (ref 78.0–100.0)
MONO ABS: 0.4 10*3/uL (ref 0.1–1.0)
MONOS PCT: 7 %
NEUTROS PCT: 42 %
Neutro Abs: 2.7 10*3/uL (ref 1.7–7.7)
Platelets: 209 10*3/uL (ref 150–400)
RBC: 4.45 MIL/uL (ref 3.87–5.11)
RDW: 12.6 % (ref 11.5–15.5)
WBC: 6.5 10*3/uL (ref 4.0–10.5)

## 2017-07-02 LAB — URINALYSIS, ROUTINE W REFLEX MICROSCOPIC
Bilirubin Urine: NEGATIVE
GLUCOSE, UA: NEGATIVE mg/dL
HGB URINE DIPSTICK: NEGATIVE
KETONES UR: NEGATIVE mg/dL
LEUKOCYTES UA: NEGATIVE
Nitrite: NEGATIVE
PH: 7 (ref 5.0–8.0)
Protein, ur: NEGATIVE mg/dL
Specific Gravity, Urine: 1.012 (ref 1.005–1.030)

## 2017-07-02 LAB — POCT PREGNANCY, URINE: Preg Test, Ur: NEGATIVE

## 2017-07-02 LAB — INFLUENZA PANEL BY PCR (TYPE A & B)
INFLAPCR: NEGATIVE
INFLBPCR: NEGATIVE

## 2017-07-02 NOTE — Discharge Instructions (Signed)
Upper Respiratory Infection, Adult Most upper respiratory infections (URIs) are caused by a virus. A URI affects the nose, throat, and upper air passages. The most common type of URI is often called "the common cold." Follow these instructions at home:  Take medicines only as told by your doctor.  Gargle warm saltwater or take cough drops to comfort your throat as told by your doctor.  Use a warm mist humidifier or inhale steam from a shower to increase air moisture. This may make it easier to breathe.  Drink enough fluid to keep your pee (urine) clear or pale yellow.  Eat soups and other clear broths.  Have a healthy diet.  Rest as needed.  Go back to work when your fever is gone or your doctor says it is okay. ? You may need to stay home longer to avoid giving your URI to others. ? You can also wear a face mask and wash your hands often to prevent spread of the virus.  Use your inhaler more if you have asthma.  Do not use any tobacco products, including cigarettes, chewing tobacco, or electronic cigarettes. If you need help quitting, ask your doctor. Contact a doctor if:  You are getting worse, not better.  Your symptoms are not helped by medicine.  You have chills.  You are getting more short of breath.  You have brown or red mucus.  You have yellow or brown discharge from your nose.  You have pain in your face, especially when you bend forward.  You have a fever.  You have puffy (swollen) neck glands.  You have pain while swallowing.  You have white areas in the back of your throat. Get help right away if:  You have very bad or constant: ? Headache. ? Ear pain. ? Pain in your forehead, behind your eyes, and over your cheekbones (sinus pain). ? Chest pain.  You have long-lasting (chronic) lung disease and any of the following: ? Wheezing. ? Long-lasting cough. ? Coughing up blood. ? A change in your usual mucus.  You have a stiff neck.  You have  changes in your: ? Vision. ? Hearing. ? Thinking. ? Mood. This information is not intended to replace advice given to you by your health care provider. Make sure you discuss any questions you have with your health care provider. Document Released: 11/12/2007 Document Revised: 01/27/2016 Document Reviewed: 08/31/2013 Elsevier Interactive Patient Education  2018 Elsevier Inc.  

## 2017-07-02 NOTE — MAU Note (Signed)
Pt reports back pain , abd pain , chest pain x 2-3 months, worse over the last 2 weeks

## 2017-07-02 NOTE — MAU Provider Note (Signed)
History     CSN: [id number]  Arrival date and time: 07/02/17 1530   First Provider Initiated Contact with Patient 07/02/17 1703      Chief Complaint  Patient presents with  . Abdominal Pain  . Back Pain   HPI  Abigail Hardin is a 27 y.o. [phone] female who presents to MAU today with complaint of abdominal and back pain. She states pain has been present x 3 months. She feels it is worse recently and worse with movement. She has also noted pressure on her chest and back. She rates pain at 7/10 now. She has not taken anything for pain. She has also had congestion and cough x 2 weeks without fever. She denies vaginal bleeding, discharge, UTI symptoms, vomiting or diarrhea. She has had some nausea. She states LMP 05/16/17. She is not on birth control and usually has regular periods.   OB History    Gravida Para Term Preterm AB Living   5 2 2  0 3 2   SAB TAB Ectopic Multiple Live Births   1 2 0 0 2      History reviewed. No pertinent past medical history.  Past Surgical History:  Procedure Laterality Date  . DILATION AND CURETTAGE OF UTERUS    . INDUCED ABORTION  04/09/2011  . TONSILLECTOMY      Family History  Problem Relation Age of Onset  . Asthma Daughter   . Asthma Son   . Hypertension Maternal Aunt     Social History   Tobacco Use  . Smoking status: Current Every Day Smoker    Packs/day: 0.25    Years: 18.00    Pack years: 4.50    Types: Cigarettes  . Smokeless tobacco: Never Used  Substance Use Topics  . Alcohol use: Yes  . Drug use: No    Allergies: No Known Allergies  No medications prior to admission.    Review of Systems  Constitutional: Negative for fever.  HENT: Positive for congestion and rhinorrhea.   Respiratory: Positive for cough and shortness of breath.   Gastrointestinal: Positive for abdominal pain and nausea. Negative for constipation, diarrhea and vomiting.  Genitourinary: Negative for dysuria, frequency, urgency, vaginal bleeding  and vaginal discharge.  Musculoskeletal: Positive for back pain.   Physical Exam   Blood pressure 115/82, pulse 67, temperature 97.7 F (36.5 C), temperature source Oral, resp. rate 18, height 5\' 5"  (1.651 m), weight 209 lb (94.8 kg), last menstrual period 05/16/2017, SpO2 99 %, unknown if currently breastfeeding.  Physical Exam  Nursing note and vitals reviewed. Constitutional: She is oriented to person, place, and time. She appears well-developed and well-nourished. No distress.  HENT:  Head: Normocephalic and atraumatic.  Eyes: EOM are normal.  Neck: Normal range of motion. Neck supple.  Cardiovascular: Normal rate, regular rhythm and normal heart sounds.  No murmur heard. Respiratory: Effort normal and breath sounds normal. No respiratory distress. She has no wheezes.  GI: Soft. Bowel sounds are normal. She exhibits no distension and no mass. There is tenderness (diffuse tenderness to palpation). There is no rebound, no guarding and no CVA tenderness.  Musculoskeletal:       Cervical back: She exhibits tenderness. She exhibits normal range of motion, no bony tenderness, no swelling, no pain and no spasm.       Thoracic back: She exhibits tenderness. She exhibits normal range of motion, no bony tenderness, no swelling, no edema and no spasm.       Lumbar back:  She exhibits tenderness. She exhibits normal range of motion, no bony tenderness, no swelling and no pain.  Lymphadenopathy:    She has no cervical adenopathy.  Neurological: She is alert and oriented to person, place, and time.  Skin: Skin is warm and dry. No erythema.  Psychiatric: She has a normal mood and affect.    Results for orders placed or performed during the hospital encounter of 07/02/17 (from the past 24 hour(s))  Urinalysis, Routine w reflex microscopic     Status: None   Collection Time: 07/02/17  3:57 PM  Result Value Ref Range   Color, Urine YELLOW YELLOW   APPearance CLEAR CLEAR   Specific Gravity, Urine  1.012 1.005 - 1.030   pH 7.0 5.0 - 8.0   Glucose, UA NEGATIVE NEGATIVE mg/dL   Hgb urine dipstick NEGATIVE NEGATIVE   Bilirubin Urine NEGATIVE NEGATIVE   Ketones, ur NEGATIVE NEGATIVE mg/dL   Protein, ur NEGATIVE NEGATIVE mg/dL   Nitrite NEGATIVE NEGATIVE   Leukocytes, UA NEGATIVE NEGATIVE  Pregnancy, urine POC     Status: None   Collection Time: 07/02/17  4:13 PM  Result Value Ref Range   Preg Test, Ur NEGATIVE NEGATIVE  Influenza panel by PCR (type A & B)     Status: None   Collection Time: 07/02/17  5:14 PM  Result Value Ref Range   Influenza A By PCR NEGATIVE NEGATIVE   Influenza B By PCR NEGATIVE NEGATIVE  CBC with Differential/Platelet     Status: None   Collection Time: 07/02/17  5:23 PM  Result Value Ref Range   WBC 6.5 4.0 - 10.5 K/uL   RBC 4.45 3.87 - 5.11 MIL/uL   Hemoglobin 14.0 12.0 - 15.0 g/dL   HCT 16.1 09.6 - 04.5 %   MCV 90.3 78.0 - 100.0 fL   MCH 31.5 26.0 - 34.0 pg   MCHC 34.8 30.0 - 36.0 g/dL   RDW 40.9 81.1 - 91.4 %   Platelets 209 150 - 400 K/uL   Neutrophils Relative % 42 %   Neutro Abs 2.7 1.7 - 7.7 K/uL   Lymphocytes Relative 48 %   Lymphs Abs 3.1 0.7 - 4.0 K/uL   Monocytes Relative 7 %   Monocytes Absolute 0.4 0.1 - 1.0 K/uL   Eosinophils Relative 3 %   Eosinophils Absolute 0.2 0.0 - 0.7 K/uL   Basophils Relative 1 %   Basophils Absolute 0.0 0.0 - 0.1 K/uL  Comprehensive metabolic panel     Status: Abnormal   Collection Time: 07/02/17  5:23 PM  Result Value Ref Range   Sodium 135 135 - 145 mmol/L   Potassium 3.8 3.5 - 5.1 mmol/L   Chloride 102 101 - 111 mmol/L   CO2 26 22 - 32 mmol/L   Glucose, Bld 92 65 - 99 mg/dL   BUN 10 6 - 20 mg/dL   Creatinine, Ser 7.82 0.44 - 1.00 mg/dL   Calcium 8.7 (L) 8.9 - 10.3 mg/dL   Total Protein 7.0 6.5 - 8.1 g/dL   Albumin 3.7 3.5 - 5.0 g/dL   AST 15 15 - 41 U/L   ALT 12 (L) 14 - 54 U/L   Alkaline Phosphatase 102 38 - 126 U/L   Total Bilirubin 0.7 0.3 - 1.2 mg/dL   GFR calc non Af Amer >60 >60 mL/min    GFR calc Af Amer >60 >60 mL/min   Anion gap 7 5 - 15    MAU Course  Procedures None  MDM  UPT - negative UA, CXR, CBC, CMP, influenza today  Assessment and Plan  A: Viral URI   P: Discharge home List of OTC medications for symptomatic management given Increased PO hydration and rest Warning signs for worsening condition discussed Patient advised to follow-up with PCP of choice if symptoms persist or worsen Patient may return to MAU as needed however this is not a GYN issue and patient is not pregnant, so further evaluation would be best at UC, [facility] or [facility]  Vonzella Nipple, PA-C 07/02/2017, 7:01 PM

## 2018-01-28 ENCOUNTER — Encounter (HOSPITAL_COMMUNITY): Payer: Self-pay | Admitting: Emergency Medicine

## 2018-01-28 ENCOUNTER — Emergency Department (HOSPITAL_COMMUNITY)
Admission: EM | Admit: 2018-01-28 | Discharge: 2018-01-28 | Disposition: A | Discharge status: Home / Self Care | Payer: No Typology Code available for payment source | Attending: Emergency Medicine | Admitting: Emergency Medicine

## 2018-01-28 DIAGNOSIS — F17210 Nicotine dependence, cigarettes, uncomplicated: Secondary | ICD-10-CM | POA: Insufficient documentation

## 2018-01-28 DIAGNOSIS — K047 Periapical abscess without sinus: Secondary | ICD-10-CM | POA: Insufficient documentation

## 2018-01-28 DIAGNOSIS — Z79899 Other long term (current) drug therapy: Secondary | ICD-10-CM | POA: Insufficient documentation

## 2018-01-28 MED ORDER — BENZOCAINE 10 % MT GEL
Freq: Once | OROMUCOSAL | Status: DC
  Filled 2018-01-28: qty 9

## 2018-01-28 MED ORDER — CHLORHEXIDINE GLUCONATE 0.12 % MT SOLN: 15.0000 mL | Freq: Two times a day (BID) | OROMUCOSAL | 0 refills | Status: DC

## 2018-01-28 MED ORDER — PENICILLIN V POTASSIUM 500 MG PO TABS: 500.0000 mg | ORAL_TABLET | Freq: Four times a day (QID) | ORAL | 0 refills | Status: AC

## 2018-01-28 NOTE — ED Notes (Signed)
Called pharmacy for medication

## 2018-01-28 NOTE — Discharge Instructions (Signed)
Please return to the Emergency Department for any new or worsening symptoms or if your symptoms do not improve. Please be sure to follow up with your Primary Care Physician as soon as possible regarding your visit today. If you do not have a Primary Doctor please use the resources below to establish one. You MUST follow-up with a dentist as soon as possible.  Please use the resources below to find a dentist. Please take all of your antibiotic medication penicillin as prescribed for the next week. You may also use the mouth rinse Peridex as prescribed. You may use over-the-counter anti-inflammatory medication such as Tylenol and ibuprofen as directed on the packaging for pain.  Contact a health care provider if: Your pain is worse and is not helped by medicine. Get help right away if: You have a fever or chills. Your symptoms suddenly get worse. You have a very bad headache. You have problems breathing or swallowing. You have trouble opening your mouth. You have swelling in your neck or around your eye.  RESOURCE GUIDE  Chronic Pain Problems: Contact [facility]  [phone] Patients need to be referred by their primary care doctor.  Insufficient Money for Medicine: Contact United Way:  call "211" or [facility] [phone].  No Primary Care Doctor: Call Health Connect  [phone] - can help you locate a primary care doctor that  accepts your insurance, provides certain services, etc. Physician Referral Service- [phone]  Agencies that provide inexpensive medical care: [facility]  [phone] [facility]  [phone] [facility]  [phone] [facility]  [phone] Planned Parenthood  [phone] [facility]  [phone]  Medicaid-accepting [place] [facility] Providers: [facility]- [address], [address]  [phone], Mon-Fri 9am-7pm, Sat 9am-1pm [facility]- [address], [address] [place]  [phone] [facility]- [address], [address] [place]  [phone] [facility]- [address] [place] [address]  [phone] Renaye RakersVeita Bland- [address], [address], [phone]  Only accepts [place] patients after they have their name  applied to their card  Self Pay (no insurance) in [place] [facility]: Sickle Cell Patients: Dr Willey BladeEric Dean, [facility]  [address], [phone] [facility]- [address]  [phone]       Redge Gainer-     [facility] [place]- [address], [address]       -     [facility]- see information above (Speak to CitigroupPam H if you do not have insurance)       -  [facility]- [address], [phone]       -  [facility] [place]- [address],  [phone]       -  [facility]- [address] [place] [address], [phone]       -  Dr Julio Sickssei-Bonsu-  [address], [address], [place], [phone]       -  [facility]- [address], [phone]       -  [facility] [place]- [address] [place] [address], [phone], also [address], [phone]       -    [facility]- [address], [phone], 1st & 3rd Saturday   every month, 10am-1pm  1) Find a Doctor and Pay Out of Pocket Although you  won't have to find out who is covered by your insurance plan, it is a good idea to ask around and get recommendations. You will then need to call the office and see if the doctor you have chosen will accept you as a new patient and what types of options they offer for patients who are self-pay. Some doctors offer discounts or will set up payment plans for their patients who do not have insurance, but you will need to ask so you aren't surprised when you get to your appointment.  2) Contact Your Local Health Department Not all health departments have doctors that can see patients for sick visits, but many do, so it is worth a call to see if  yours does. If you don't know where your local health department is, you can check in your phone book. The CDC also has a tool to help you locate your state's health department, and many state websites also have listings of all of their local health departments.  3) Find a Walk-in Clinic If your illness is not likely to be very severe or complicated, you may want to try a walk in clinic. These are popping up all over the country in pharmacies, drugstores, and shopping centers. They're usually staffed by nurse practitioners or physician assistants that have been trained to treat common illnesses and complaints. They're usually fairly quick and inexpensive. However, if you have serious medical issues or chronic medical problems, these are probably not your best option  STD Testing [facility] Department of [facility], STD Clinic, [address], [place], phone [phone] or [phone].  Monday - Friday, call for an appointment. [facility] of [employer], STD Clinic, [address], [place], phone [phone] or [phone].  Monday - Friday, call for an appointment.  Abuse/Neglect: [facility] Child Abuse Hotline [phone] [facility] Child Abuse Hotline [phone] (After Hours)  Emergency Shelter:  [facility] [phone]  Maternity Homes: [facility] at the [place] [facility] [phone] [facility] Alert Services ([phone]  MRSA Hotline #:   940-180-1687  [facility] Resources  [facility]  United Way [facility] Dept. [address]                 [address]         [address]  Blondell Reveal Phone:  [phone]                                  Phone:  [phone]                   Phone:  [phone]  [facility], [phone] [phone]       -     [facility] in [place], [address]  [phone], [place] [phone] or [phone] (After Hours)   [place]  Substance Abuse Resources: Alcohol and Drug Services  [place] 205-104-6995 The [facility] Chinita Pester [phone] Residential & Outpatient Substance Abuse Program  [phone]  Psychological Services: [place]  [phone] [place]  [place], [place] [address] [place], [place]: [phone] or [phone], PicCapture.uy  Dental Assistance  If unable to pay or uninsured, contact:  [facility] or [facility]. to become qualified for the adult dental clinic.  Patients with Medicaid: [facility] [phone] [address], [place] [address], [phone]  If unable to pay, or uninsured, contact [facility] [phone]) or [employer] [phone] in [place], [place] in [facility]) to become qualified for the adult dental clinic   Other [place]- [place], [place], [place], [address], [place], [place], 2nd and 4th Thursday of the month at 6:30am.  10 clients each day by appointment, can sometimes see walk-in patients if someone does not show for an appointment. [facility]- [address] Hillard Danker [place], [place], [address], [place], [place], [place], [address], [place] Department- [phone] [place] [facility] [phone]

## 2018-01-28 NOTE — ED Provider Notes (Signed)
[facility] EMERGENCY DEPARTMENT Provider Note   CSN: [id number] Arrival date & time: 01/28/18  1431     History   Chief Complaint Chief Complaint  Patient presents with  . Dental Pain    HPI Abigail Hardin is a 27 y.o. female presenting for right-sided lower dental pain for the past 7 days.  Patient describes the pain as a throbbing, 7/10 severity worse with chewing on that side.  Patient states that she has taken ibuprofen for her pain without relief.  Patient denies history of fevers or chills, drooling or change in voice.  Patient states that she currently does not have a dentist to follow-up with and is requesting dental resources.  HPI  History reviewed. No pertinent past medical history.  There are no active problems to display for this patient.   Past Surgical History:  Procedure Laterality Date  . DILATION AND CURETTAGE OF UTERUS    . INDUCED ABORTION  04/09/2011  . TONSILLECTOMY       OB History    Gravida  5   Para  2   Term  2   Preterm  0   AB  3   Living  2     SAB  1   TAB  2   Ectopic  0   Multiple  0   Live Births  2            Home Medications    Prior to Admission medications   Medication Sig Start Date End Date Taking? Authorizing Provider  chlorhexidine (PERIDEX) 0.12 % solution Use as directed 15 mLs in the mouth or throat 2 (two) times daily. 01/28/18   Bill Salinas, PA-C  elvitegravir-cobicistat-emtricitabine-tenofovir (GENVOYA) 150-150-200-10 MG TABS tablet Take 1 tablet by mouth daily with breakfast. 12/29/16   Wojeck, Hinton Dyer, NP  elvitegravir-cobicistat-emtricitabine-tenofovir (GENVOYA) 150-150-200-10 MG TABS tablet Take 1 tablet by mouth daily with breakfast. 12/30/16   Servando Salina, NP  elvitegravir-cobicistat-emtricitabine-tenofovir (GENVOYA) 150-150-200-10 MG TABS tablet Take 1 tablet by mouth daily with breakfast. 12/30/16   Wojeck, Hinton Dyer, NP  penicillin v potassium (VEETID) 500 MG  tablet Take 1 tablet (500 mg total) by mouth 4 (four) times daily for 7 days. 01/28/18 02/04/18  Bill Salinas, PA-C    Family History Family History  Problem Relation Age of Onset  . Asthma Daughter   . Asthma Son   . Hypertension Maternal Aunt     Social History Social History   Tobacco Use  . Smoking status: Current Every Day Smoker    Packs/day: 0.25    Years: 18.00    Pack years: 4.50    Types: Cigarettes  . Smokeless tobacco: Never Used  Substance Use Topics  . Alcohol use: Yes  . Drug use: No     Allergies   Patient has no known allergies.   Review of Systems Review of Systems  Constitutional: Negative.  Negative for chills, fatigue and fever.  HENT: Positive for dental problem. Negative for drooling, sore throat, trouble swallowing and voice change.   Gastrointestinal: Negative.  Negative for abdominal pain, diarrhea, nausea and vomiting.  Skin: Negative.  Negative for rash.     Physical Exam Updated Vital Signs BP 133/84   Pulse 65   Temp 97.7 F (36.5 C) (Oral)   Resp 16   LMP 01/24/2018   SpO2 100%   Physical Exam  Constitutional: She is oriented to person, place, and time. She appears well-developed and  well-nourished. No distress.  HENT:  Head: Normocephalic and atraumatic.  Right Ear: External ear normal.  Left Ear: External ear normal.  Nose: Nose normal.  Mouth/Throat: Uvula is midline, oropharynx is clear and moist and mucous membranes are normal. No trismus in the jaw. Dental abscesses and dental caries present. No uvula swelling. No posterior oropharyngeal edema, posterior oropharyngeal erythema or tonsillar abscesses.    Small dental abscess noted to the right lower jaw, as noted in picture.  No signs of peritonsillar abscess, no signs of Ludwig's angina, no signs of retropharyngeal abscess.  Eyes: Pupils are equal, round, and reactive to light. EOM are normal.  Neck: Trachea normal, normal range of motion, full passive range of  motion without pain and phonation normal. Neck supple. No spinous process tenderness and no muscular tenderness present. No neck rigidity. No tracheal deviation, no edema, no erythema and normal range of motion present.  Pulmonary/Chest: Effort normal. No respiratory distress.  Abdominal: Soft. There is no tenderness. There is no rebound and no guarding.  Musculoskeletal: Normal range of motion.  Neurological: She is alert and oriented to person, place, and time.  Skin: Skin is warm and dry.  Psychiatric: She has a normal mood and affect. Her behavior is normal.     ED Treatments / Results  Labs (all labs ordered are listed, but only abnormal results are displayed) Labs Reviewed - No data to display  EKG None  Radiology No results found.  Procedures Procedures (including critical care time)  Medications Ordered in ED Medications  benzocaine (ORAJEL) 10 % mucosal gel ( Mouth/Throat Refused 01/28/18 1610)     Initial Impression / Assessment and Plan / ED Course  I have reviewed the triage vital signs and the nursing notes.  Pertinent labs & imaging results that were available during my care of the patient were reviewed by me and considered in my medical decision making (see chart for details).    Patient with dental pain. With infected dental surface cavity noted with signs actively draining dental abscess.  Patient is well-appearing, afebrile, nontoxic, speaking well.  Patient able to swallow without pain.  No signs of swelling or concern for Ludwig's angina/Peritonsilar abscess/Retropharyngeal abscess or other deep tissue infections.  No sign of swelling of the neck, patient has good range of motion of the neck, no trismus. Will treat with penicillin VK and patient informed to use anti-inflammatories such as Tylenol for pain as directed on packaging.  Patient given Cory Munch here in department.  Patient was provided with Peridex mouthwash.  Follow-up with dentist strongly  encouraged.  At this time there does not appear to be any evidence of an acute emergency medical condition and the patient appears stable for discharge with appropriate outpatient follow up. Diagnosis was discussed with patient who verbalizes understanding of care plan and is agreeable to discharge. I have discussed return precautions with patient who verbalizes understanding of return precautions. Patient strongly encouraged to follow-up with their PCP. All questions answered.   Note: Portions of this report may have been transcribed using voice recognition software. Every effort was made to ensure accuracy; however, inadvertent computerized transcription errors may still be present.   Final Clinical Impressions(s) / ED Diagnoses   Final diagnoses:  Dental abscess    ED Discharge Orders         Ordered    penicillin v potassium (VEETID) 500 MG tablet  4 times daily     01/28/18 1538    chlorhexidine (PERIDEX) 0.12 %  solution  2 times daily     01/28/18 1538           Elizabeth PalauMorelli, Lekeya Rollings A, PA-C 01/28/18 1727    Tegeler, Canary Brimhristopher J, MD 01/28/18 2029

## 2018-01-28 NOTE — ED Notes (Signed)
Patient acuity 4, see provider assessment.  

## 2018-01-28 NOTE — ED Triage Notes (Signed)
PT reports pain over right lower jaw for 1 week

## 2018-02-11 ENCOUNTER — Emergency Department (HOSPITAL_COMMUNITY): Payer: No Typology Code available for payment source

## 2018-02-11 ENCOUNTER — Emergency Department (HOSPITAL_COMMUNITY)
Admission: EM | Admit: 2018-02-11 | Discharge: 2018-02-11 | Disposition: A | Discharge status: Home / Self Care | Payer: No Typology Code available for payment source | Attending: Emergency Medicine | Admitting: Emergency Medicine

## 2018-02-11 ENCOUNTER — Encounter (HOSPITAL_COMMUNITY): Payer: Self-pay | Admitting: *Deleted

## 2018-02-11 DIAGNOSIS — R079 Chest pain, unspecified: Secondary | ICD-10-CM

## 2018-02-11 DIAGNOSIS — F17210 Nicotine dependence, cigarettes, uncomplicated: Secondary | ICD-10-CM | POA: Insufficient documentation

## 2018-02-11 LAB — BASIC METABOLIC PANEL
Anion gap: 8 (ref 5–15)
BUN: 9 mg/dL (ref 6–20)
CALCIUM: 9 mg/dL (ref 8.9–10.3)
CO2: 26 mmol/L (ref 22–32)
CREATININE: 0.78 mg/dL (ref 0.44–1.00)
Chloride: 105 mmol/L (ref 98–111)
GFR calc Af Amer: >60 mL/min (ref >60)
GFR calc non Af Amer: >60 mL/min (ref >60)
GLUCOSE: 106 mg/dL — AB (ref 70–99)
Potassium: 3.4 mmol/L — ABNORMAL LOW (ref 3.5–5.1)
Sodium: 139 mmol/L (ref 135–145)

## 2018-02-11 LAB — CBC
HCT: 39.9 % (ref 36.0–46.0)
Hemoglobin: 14 g/dL (ref 12.0–15.0)
MCH: 31.5 pg (ref 26.0–34.0)
MCHC: 35.1 g/dL (ref 30.0–36.0)
MCV: 89.9 fL (ref 78.0–100.0)
PLATELETS: 273 10*3/uL (ref 150–400)
RBC: 4.44 MIL/uL (ref 3.87–5.11)
RDW: 12.7 % (ref 11.5–15.5)
WBC: 6.7 10*3/uL (ref 4.0–10.5)

## 2018-02-11 LAB — I-STAT BETA HCG BLOOD, ED (MC, WL, AP ONLY): I-stat hCG, quantitative: <5 m[IU]/mL (ref <5)

## 2018-02-11 LAB — I-STAT TROPONIN, ED: TROPONIN I, POC: 0.02 ng/mL (ref 0.00–0.08)

## 2018-02-11 MED ORDER — METHOCARBAMOL 500 MG PO TABS: 500.0000 mg | ORAL_TABLET | Freq: Two times a day (BID) | ORAL | 0 refills | Status: DC
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
## 2018-02-11 NOTE — Discharge Instructions (Signed)
You may alternate taking Tylenol and Ibuprofen as needed for pain control. You may take 400-600 mg of ibuprofen every 6 hours and [phone] mg of Tylenol every 6 hours. Do not exceed 4000 mg of Tylenol daily as this can lead to liver damage. Also, make sure to take Ibuprofen with meals as it can cause an upset stomach. Do not take other NSAIDs while taking Ibuprofen such as (Aleve, Naprosyn, Aspirin, Celebrex, etc) and do not take more than the prescribed dose as this can lead to ulcers and bleeding in your GI tract. You may use warm and cold compresses to help with your symptoms.   You were given a prescription for Robaxin which is a muscle relaxer.  You should not drive, work, or operate machinery while taking this medication as it can make you very drowsy.    Please return to the ER immediately if you choose to continue your work-up.  Return to the ER as well if you have any new or worsening symptoms in the meantime.

## 2019-02-28 ENCOUNTER — Inpatient Hospital Stay (HOSPITAL_COMMUNITY): Payer: Medicaid Other

## 2019-02-28 ENCOUNTER — Inpatient Hospital Stay (HOSPITAL_COMMUNITY)
Admission: AD | Admit: 2019-02-28 | Discharge: 2019-02-28 | Disposition: A | Discharge status: Home / Self Care | Payer: Medicaid Other | Attending: Obstetrics and Gynecology | Admitting: Obstetrics and Gynecology

## 2019-02-28 ENCOUNTER — Other Ambulatory Visit: Payer: Self-pay | Admitting: Certified Nurse Midwife

## 2019-02-28 ENCOUNTER — Other Ambulatory Visit: Payer: Self-pay

## 2019-02-28 ENCOUNTER — Encounter (HOSPITAL_COMMUNITY): Payer: Self-pay | Admitting: *Deleted

## 2019-02-28 DIAGNOSIS — Y92410 Unspecified street and highway as the place of occurrence of the external cause: Secondary | ICD-10-CM | POA: Diagnosis not present

## 2019-02-28 DIAGNOSIS — O4690 Antepartum hemorrhage, unspecified, unspecified trimester: Secondary | ICD-10-CM

## 2019-02-28 DIAGNOSIS — Z79899 Other long term (current) drug therapy: Secondary | ICD-10-CM | POA: Diagnosis not present

## 2019-02-28 DIAGNOSIS — Z87891 Personal history of nicotine dependence: Secondary | ICD-10-CM | POA: Diagnosis not present

## 2019-02-28 DIAGNOSIS — Z6790 Unspecified blood type, Rh positive: Secondary | ICD-10-CM | POA: Diagnosis not present

## 2019-02-28 DIAGNOSIS — R109 Unspecified abdominal pain: Secondary | ICD-10-CM | POA: Diagnosis not present

## 2019-02-28 DIAGNOSIS — O26891 Other specified pregnancy related conditions, first trimester: Secondary | ICD-10-CM | POA: Insufficient documentation

## 2019-02-28 DIAGNOSIS — O360910 Maternal care for other rhesus isoimmunization, first trimester, not applicable or unspecified: Secondary | ICD-10-CM

## 2019-02-28 DIAGNOSIS — Z8759 Personal history of other complications of pregnancy, childbirth and the puerperium: Secondary | ICD-10-CM | POA: Insufficient documentation

## 2019-02-28 DIAGNOSIS — O161 Unspecified maternal hypertension, first trimester: Secondary | ICD-10-CM | POA: Insufficient documentation

## 2019-02-28 DIAGNOSIS — O4691 Antepartum hemorrhage, unspecified, first trimester: Secondary | ICD-10-CM | POA: Diagnosis not present

## 2019-02-28 DIAGNOSIS — Z3A01 Less than 8 weeks gestation of pregnancy: Secondary | ICD-10-CM | POA: Insufficient documentation

## 2019-02-28 DIAGNOSIS — Z3490 Encounter for supervision of normal pregnancy, unspecified, unspecified trimester: Secondary | ICD-10-CM

## 2019-02-28 DIAGNOSIS — Z8249 Family history of ischemic heart disease and other diseases of the circulatory system: Secondary | ICD-10-CM | POA: Diagnosis not present

## 2019-02-28 DIAGNOSIS — O9989 Other specified diseases and conditions complicating pregnancy, childbirth and the puerperium: Secondary | ICD-10-CM | POA: Insufficient documentation

## 2019-02-28 HISTORY — DX: Essential (primary) hypertension: I10

## 2019-02-28 LAB — CBC
HCT: 36.5 % (ref 36.0–46.0)
Hemoglobin: 12.9 g/dL (ref 12.0–15.0)
MCH: 32.6 pg (ref 26.0–34.0)
MCHC: 35.3 g/dL (ref 30.0–36.0)
MCV: 92.2 fL (ref 80.0–100.0)
Platelets: 205 10*3/uL (ref 150–400)
RBC: 3.96 MIL/uL (ref 3.87–5.11)
RDW: 12.4 % (ref 11.5–15.5)
WBC: 7 10*3/uL (ref 4.0–10.5)
nRBC: 0 % (ref 0.0–0.2)

## 2019-02-28 LAB — URINALYSIS, ROUTINE W REFLEX MICROSCOPIC
Bilirubin Urine: NEGATIVE
Glucose, UA: NEGATIVE mg/dL
Ketones, ur: NEGATIVE mg/dL
Leukocytes,Ua: NEGATIVE
Nitrite: NEGATIVE
Protein, ur: NEGATIVE mg/dL
RBC / HPF: >50 RBC/hpf — ABNORMAL HIGH (ref 0–5)
Specific Gravity, Urine: 1.011 (ref 1.005–1.030)
pH: 5 (ref 5.0–8.0)

## 2019-02-28 LAB — WET PREP, GENITAL
Sperm: NONE SEEN
Trich, Wet Prep: NONE SEEN

## 2019-02-28 LAB — HCG, QUANTITATIVE, PREGNANCY: hCG, Beta Chain, Quant, S: 6121 m[IU]/mL — ABNORMAL HIGH (ref <5)

## 2019-02-28 MED ORDER — ACETAMINOPHEN 500 MG PO TABS
1000.0000 mg | ORAL_TABLET | Freq: Four times a day (QID) | ORAL | Status: DC | PRN
  Administered 2019-02-28: 1000 mg via ORAL
  Filled 2019-02-28: qty 2

## 2019-02-28 NOTE — Discharge Instructions (Signed)
Vaginal Bleeding During Pregnancy, First Trimester ° °A small amount of bleeding (spotting) from the vagina is common during early pregnancy. Sometimes the bleeding is normal and does not cause problems. At other times, though, bleeding may be a sign of something serious. Tell your doctor about any bleeding from your vagina right away. °Follow these instructions at home: °Activity °· Follow your doctor's instructions about how active you can be. °· If needed, make plans for someone to help with your normal activities. °· Do not have sex or orgasms until your doctor says that this is safe. °General instructions °· Take over-the-counter and prescription medicines only as told by your doctor. °· Watch your condition for any changes. °· Write down: °? The number of pads you use each day. °? How often you change pads. °? How soaked (saturated) your pads are. °· Do not use tampons. °· Do not douche. °· If you pass any tissue from your vagina, save it to show to your doctor. °· Keep all follow-up visits as told by your doctor. This is important. °Contact a doctor if: °· You have vaginal bleeding at any time while you are pregnant. °· You have cramps. °· You have a fever. °Get help right away if: °· You have very bad cramps in your back or belly (abdomen). °· You pass large clots or a lot of tissue from your vagina. °· Your bleeding gets worse. °· You feel light-headed. °· You feel weak. °· You pass out (faint). °· You have chills. °· You are leaking fluid from your vagina. °· You have a gush of fluid from your vagina. °Summary °· Sometimes vaginal bleeding during pregnancy is normal and does not cause problems. At other times, bleeding may be a sign of something serious. °· Tell your doctor about any bleeding from your vagina right away. °· Follow your doctor's instructions about how active you can be. You may need someone to help you with your normal activities. °This information is not intended to replace advice given to  you by your health care provider. Make sure you discuss any questions you have with your health care provider. °Document Released: 10/10/2013 Document Revised: 09/14/2018 Document Reviewed: 08/27/2016 °Elsevier Patient Education © 2020 [employer] ° °

## 2019-02-28 NOTE — Progress Notes (Signed)
Blind swabs sent.
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
## 2019-03-02 LAB — CERVICOVAGINAL ANCILLARY ONLY
Chlamydia: NEGATIVE
Neisseria Gonorrhea: NEGATIVE

## 2019-03-02 LAB — URINE CYTOLOGY ANCILLARY ONLY
Chlamydia: NEGATIVE
Neisseria Gonorrhea: NEGATIVE

## 2019-03-10 ENCOUNTER — Other Ambulatory Visit: Payer: Self-pay

## 2019-03-10 ENCOUNTER — Ambulatory Visit (HOSPITAL_COMMUNITY)
Admission: RE | Admit: 2019-03-10 | Discharge: 2019-03-10 | Disposition: A | Discharge status: Home / Self Care | Payer: Medicaid Other | Source: Ambulatory Visit | Attending: Certified Nurse Midwife | Admitting: Certified Nurse Midwife

## 2019-03-10 ENCOUNTER — Ambulatory Visit (INDEPENDENT_AMBULATORY_CARE_PROVIDER_SITE_OTHER): Payer: Self-pay | Admitting: Obstetrics and Gynecology

## 2019-03-10 DIAGNOSIS — O4690 Antepartum hemorrhage, unspecified, unspecified trimester: Secondary | ICD-10-CM | POA: Diagnosis present

## 2019-03-10 DIAGNOSIS — Z6790 Unspecified blood type, Rh positive: Secondary | ICD-10-CM | POA: Diagnosis present

## 2019-03-10 DIAGNOSIS — O021 Missed abortion: Secondary | ICD-10-CM

## 2019-03-10 DIAGNOSIS — Z3490 Encounter for supervision of normal pregnancy, unspecified, unspecified trimester: Secondary | ICD-10-CM | POA: Insufficient documentation

## 2019-03-10 NOTE — Progress Notes (Signed)
Here for [place] follow up. US shows incomplete SAB She was added to my schedule to discuss results and options.   US Ob Transvaginal  Addendum Date: 03/10/2019   ADDENDUM REPORT: 03/10/2019 10:20 ADDENDUM: Since there was a yolk sac previously noted 10 days ago, findings meet definitive criteria for failed pregnancy. This follows SRU consensus guidelines: Diagnostic Criteria for Nonviable Pregnancy Early in the First Trimester. Alison Stalling J Med [id number]. Electronically Signed   By: Rolm Baptise M.D.   On: 03/10/2019 10:20   Result Date: 03/10/2019 CLINICAL DATA:  Inconclusive viability EXAM: TRANSVAGINAL OB ULTRASOUND TECHNIQUE: Transvaginal ultrasound was performed for complete evaluation of the gestation as well as the maternal uterus, adnexal regions, and pelvic cul-de-sac. COMPARISON:  02/28/2019 FINDINGS: Intrauterine gestational sac: Single Yolk sac:  Visualized Embryo:  Not visualized Cardiac Activity: Not visualized Heart Rate:  bpm MSD: 9.7 mm   5 w   5 d CRL:     mm    w  d                  [place] EDC: Subchorionic hemorrhage:  None visualized. Maternal uterus/adnexae: No adnexal mass or free fluid. IMPRESSION: Intrauterine gestational sac with yolk sac, but no fetal pole. Minimal change in size since prior study performed 10 days ago. Findings are suspicious but not yet definitive for failed pregnancy. Recommend follow-up US in 10-14 days for definitive diagnosis. This recommendation follows SRU consensus guidelines: Diagnostic Criteria for Nonviable Pregnancy Early in the First Trimester. Alta Corning Med 2013; 562:1308-65. Electronically Signed: By: Rolm Baptise M.D. On: 03/10/2019 09:27     She was counseled on options for management--observation, medical treatment with Cytotec,  or D&E.  She elected to proceed with observation. Patient and significant other crying and upset, unable to make a decision at this time. She was agreeable to an appointment next week to discuss further options  Genetta Fiero,  Artist Pais, NP 03/14/2019 12:54 PM

## 2019-03-14 ENCOUNTER — Ambulatory Visit (INDEPENDENT_AMBULATORY_CARE_PROVIDER_SITE_OTHER): Payer: Self-pay | Admitting: Family Medicine

## 2019-03-14 ENCOUNTER — Telehealth: Payer: Self-pay | Admitting: Family Medicine

## 2019-03-14 ENCOUNTER — Telehealth: Payer: Self-pay | Admitting: Student

## 2019-03-14 ENCOUNTER — Encounter: Payer: Self-pay | Admitting: Family Medicine

## 2019-03-14 ENCOUNTER — Other Ambulatory Visit: Payer: Self-pay

## 2019-03-14 DIAGNOSIS — O021 Missed abortion: Secondary | ICD-10-CM

## 2019-03-14 MED ORDER — MISOPROSTOL 200 MCG PO TABS: 800.0000 ug | ORAL_TABLET | Freq: Once | ORAL | 0 refills | Status: DC

## 2019-03-14 NOTE — Telephone Encounter (Signed)
   OB/GYN Telephone Consult  @DATE @  Abigail Hardin is a 28 y.o. [phone] currently at [redacted]w[redacted]d who is being followed for a missed abortion.   I performed a chart review on the patient and reviewed available documentation.  Recommendations:   Early Intrauterine Pregnancy Failure Protocol X  Documented intrauterine pregnancy failure less than or equal to [redacted] weeks gestation  X  No serious current illness  X  Baseline Hgb greater than or equal to 10g/dl  X  Patient has easily accessible transportation to the hospital  X  Clear preference  X  Practitioner/physician deems patient reliable  X  Counseling by practitioner or physician  X  Patient education by RN  X  Consent form signed       Rho-Gam given by RN if indicated  X  Medication sent to pharmacy X  Cytotec 800 mcg Intravaginally by patient at home  X   Ibuprofen 600 mg 1 tablet by mouth every 6 hours as needed #30 - prescribed  X   Tylenol #3 mg by mouth every 4 to 6 hours as needed - prescribed   Reviewed with pt cytotec procedure.  Pt verbalizes that she lives close to the hospital and has transportation readily available.  Pt appears reliable and verbalizes understanding and agrees with plan of care  -Recommended MD/APP provide the patient with a referral to the [facility] (any office) for follow up in  1-2 weeks.   I spent approximately 10 minutes directly counseling the patient and verbally discussing her case. Additionally 10 minutes minutes was spent performing chart review and documentation.   Merilyn Baba, DO OB Fellow, Faculty Practice 03/14/2019 1:24 PM  Criteria for phone consult billing? (If answer to any of these are yes then you cannot bill this telephone consult) Will the patient be seen urgently (within 24hrs) at a [facility] practice? No Is this a patient on which I performed surgery within the last 7d? No Have you billed a telephone consult on this patient in the last 7d? No  [x]  send message  to [place] and Coding Pool to assure this is billed appropriately  CPT Code (based on total time, direct and indirect) 99441= 5-10 min 99442= 11-20 min 99443= 21-30 min

## 2019-03-14 NOTE — Telephone Encounter (Signed)
Attempted to call patient with her appointment for tomorrow @ 8:35. No answer, left voicemail with the appointment information. Patient instructed to give the office a call if needing to reschedule.
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
## 2019-03-15 ENCOUNTER — Telehealth: Payer: Self-pay

## 2019-03-15 ENCOUNTER — Ambulatory Visit: Payer: Self-pay | Admitting: Student

## 2019-03-15 NOTE — Telephone Encounter (Signed)
Pharmacy left message on nurse VM asking for clarification about Cytotec order from 03/14/19.   Returned call to pharmacy at 1202 to verify Cytotec 800 mcg order is correct. Quantity to be dispensed is 4 tablets. Pharmacy reports they filled rx for 3 tablets and dispensed to pt this morning. Pharmacy will call pt to see if she has taken the medicine and will call our office directly after.   Pharmacy called office at 1209 and reported pt has not used the tablets. They will dispense 1 additional 200 mcg tablet to pt for a total of 800 mcg.

## 2019-03-28 ENCOUNTER — Ambulatory Visit: Payer: Self-pay | Admitting: Family Medicine

## 2019-03-28 ENCOUNTER — Encounter: Payer: Self-pay | Admitting: Family Medicine

## 2019-03-28 ENCOUNTER — Other Ambulatory Visit: Payer: Self-pay

## 2019-06-10 NOTE — L&D Delivery Note (Signed)
Operative Delivery Note  Abigail Hardin is a 29 y.o. [id number] s/p vaginal delivery at [redacted]w[redacted]d. She was admitted for IOL secondary to cHTN.   ROM: 0h 67m with moderate meconium stained fluid GBS Status: positive, s/p adequate antibiotics prior to delivery Maximum Maternal Temperature: 97.42F  Labor Progress: On admission pt received cytotec x1 and was then transitioned to pitocin at 1546. She was noted to have complete cervical dilation at 1900 and had AROM for moderate meconium stained fluid just prior to a brief second stage. Given persistent fetal terminal bradycardia with moderate meconium and minimal progress with maternal pushing, pt was consented for a vacuum-assisted vaginal delivery as noted below. Fetal station was +2 prior to placement of vacuum. No pop-offs with vacuum. Infant delivered s/p 1 contraction with vacuum assistance.  Verbal consent: obtained from patient.  Risks and benefits discussed in detail.  Risks include, but are not limited to the risks of anesthesia, bleeding, infection, damage to maternal tissues, fetal cephalhematoma.  There is also the risk of inability to effect vaginal delivery of the head, or shoulder dystocia that cannot be resolved by established maneuvers, leading to the need for emergency cesarean section.  Delivery Date/Time: 2121 on 05/26/20  Delivery: Head delivered LOA. Vacuum removed s/p delivery of fetal head. No nuchal cord present. Shoulder and body delivered in usual fashion. Infant with spontaneous cry, placed on mother's abdomen, dried and stimulated. Cord clamped x 2 after 1-minute delay, and cut by FOB under my direct supervision. Cord blood drawn. Placenta delivered spontaneously with gentle cord traction. Fundus firm with massage and Pitocin. Labia, perineum, vagina, and cervix were inspected, no evidence of lacerations.   APGAR: 8, 9; weight per medical record.   Placenta status: 3-vessel cord, intact, sent to L&D  No complications with vacuum  application. Arterial Cord pH: 7.191  Anesthesia: epidural Instruments: Kiki vacuum Episiotomy: none Lacerations:  none Suture Repair: n/a Est. Blood Loss (mL): 25 ml  Mom to postpartum.  Baby to Couplet care / Skin to Skin.  Lynnda Shields, MD OB/GYN Fellow, Faculty Practice

## 2019-10-25 ENCOUNTER — Ambulatory Visit (INDEPENDENT_AMBULATORY_CARE_PROVIDER_SITE_OTHER): Payer: Medicaid Other | Admitting: *Deleted

## 2019-10-25 ENCOUNTER — Other Ambulatory Visit: Payer: Self-pay

## 2019-10-25 ENCOUNTER — Encounter: Payer: Self-pay | Admitting: General Practice

## 2019-10-25 VITALS — BP 115/78 | HR 79 | Temp 97.8°F | Ht 65.0 in | Wt 220.6 lb

## 2019-10-25 DIAGNOSIS — Z3201 Encounter for pregnancy test, result positive: Secondary | ICD-10-CM | POA: Diagnosis not present

## 2019-10-25 DIAGNOSIS — O021 Missed abortion: Secondary | ICD-10-CM

## 2019-10-25 DIAGNOSIS — Z3200 Encounter for pregnancy test, result unknown: Secondary | ICD-10-CM

## 2019-10-25 DIAGNOSIS — Z3480 Encounter for supervision of other normal pregnancy, unspecified trimester: Secondary | ICD-10-CM

## 2019-10-25 LAB — POCT URINE PREGNANCY: Preg Test, Ur: POSITIVE — AB

## 2019-10-25 MED ORDER — PRENATAL PLUS 27-1 MG PO TABS: 1.0000 | ORAL_TABLET | Freq: Every day | ORAL | 0 refills | Status: DC

## 2019-10-25 NOTE — Progress Notes (Signed)
   Ms. Buckle presents today for UPT. She has no unusual complaints. LMP: 08/27/2019    OBJECTIVE: Appears well, in no apparent distress.  OB History    Gravida  7   Para  3   Term  2   Preterm  1   AB  3   Living  2     SAB  1   TAB  2   Ectopic  0   Multiple  0   Live Births  2          Home UPT Result: Positive  In-Office UPT result: Positive I have reviewed the patient's medical, obstetrical, social, and family histories, and medications.   ASSESSMENT: Positive pregnancy test  PLAN Prenatal care to be completed at: [facility] for PNV sent to pharmacy  Clovis Pu, RN

## 2019-11-14 ENCOUNTER — Ambulatory Visit: Payer: Medicaid Other

## 2019-11-14 DIAGNOSIS — Z3480 Encounter for supervision of other normal pregnancy, unspecified trimester: Secondary | ICD-10-CM | POA: Insufficient documentation

## 2019-11-14 DIAGNOSIS — Z3401 Encounter for supervision of normal first pregnancy, first trimester: Secondary | ICD-10-CM

## 2019-11-14 MED ORDER — BLOOD PRESSURE MONITOR DEVI: 0 refills | Status: DC

## 2019-11-14 NOTE — Progress Notes (Signed)
Pap completed at  [facility]- 2020-abnormal. Patient will have records sent.

## 2019-11-22 ENCOUNTER — Other Ambulatory Visit (HOSPITAL_COMMUNITY)
Admission: RE | Admit: 2019-11-22 | Discharge: 2019-11-22 | Disposition: A | Discharge status: Home / Self Care | Payer: Medicaid Other | Source: Ambulatory Visit | Attending: Certified Nurse Midwife | Admitting: Certified Nurse Midwife

## 2019-11-22 ENCOUNTER — Encounter: Payer: Self-pay | Admitting: Certified Nurse Midwife

## 2019-11-22 ENCOUNTER — Other Ambulatory Visit: Payer: Self-pay

## 2019-11-22 ENCOUNTER — Ambulatory Visit (INDEPENDENT_AMBULATORY_CARE_PROVIDER_SITE_OTHER): Payer: Medicaid Other | Admitting: Certified Nurse Midwife

## 2019-11-22 VITALS — BP 116/67 | HR 86 | Wt 224.0 lb

## 2019-11-22 DIAGNOSIS — E669 Obesity, unspecified: Secondary | ICD-10-CM

## 2019-11-22 DIAGNOSIS — Z3480 Encounter for supervision of other normal pregnancy, unspecified trimester: Secondary | ICD-10-CM | POA: Insufficient documentation

## 2019-11-22 DIAGNOSIS — O09299 Supervision of pregnancy with other poor reproductive or obstetric history, unspecified trimester: Secondary | ICD-10-CM | POA: Insufficient documentation

## 2019-11-22 DIAGNOSIS — F329 Major depressive disorder, single episode, unspecified: Secondary | ICD-10-CM

## 2019-11-22 DIAGNOSIS — O99210 Obesity complicating pregnancy, unspecified trimester: Secondary | ICD-10-CM

## 2019-11-22 DIAGNOSIS — Z8632 Personal history of gestational diabetes: Secondary | ICD-10-CM

## 2019-11-22 DIAGNOSIS — O10919 Unspecified pre-existing hypertension complicating pregnancy, unspecified trimester: Secondary | ICD-10-CM | POA: Insufficient documentation

## 2019-11-22 DIAGNOSIS — O99211 Obesity complicating pregnancy, first trimester: Secondary | ICD-10-CM | POA: Diagnosis not present

## 2019-11-22 DIAGNOSIS — O219 Vomiting of pregnancy, unspecified: Secondary | ICD-10-CM

## 2019-11-22 DIAGNOSIS — O99341 Other mental disorders complicating pregnancy, first trimester: Secondary | ICD-10-CM

## 2019-11-22 DIAGNOSIS — O09899 Supervision of other high risk pregnancies, unspecified trimester: Secondary | ICD-10-CM

## 2019-11-22 DIAGNOSIS — O10911 Unspecified pre-existing hypertension complicating pregnancy, first trimester: Secondary | ICD-10-CM

## 2019-11-22 DIAGNOSIS — Z3A12 12 weeks gestation of pregnancy: Secondary | ICD-10-CM

## 2019-11-22 DIAGNOSIS — O09891 Supervision of other high risk pregnancies, first trimester: Secondary | ICD-10-CM

## 2019-11-22 DIAGNOSIS — F32A Depression, unspecified: Secondary | ICD-10-CM

## 2019-11-22 DIAGNOSIS — O99340 Other mental disorders complicating pregnancy, unspecified trimester: Secondary | ICD-10-CM | POA: Insufficient documentation

## 2019-11-22 MED ORDER — ONDANSETRON 4 MG PO TBDP: 4.0000 mg | ORAL_TABLET | Freq: Three times a day (TID) | ORAL | 1 refills | Status: DC | PRN

## 2019-11-22 MED ORDER — ASPIRIN EC 81 MG PO TBEC: 81.0000 mg | DELAYED_RELEASE_TABLET | Freq: Every day | ORAL | 2 refills | Status: DC

## 2019-11-22 NOTE — Patient Instructions (Signed)
Second Trimester of Pregnancy  The second trimester is from week 14 through week 27 (month 4 through 6). This is often the time in pregnancy that you feel your best. Often times, morning sickness has lessened or quit. You may have more energy, and you may get hungry more often. Your unborn baby is growing rapidly. At the end of the sixth month, he or she is about 9 inches long and weighs about 1 pounds. You will likely feel the baby move between 18 and 20 weeks of pregnancy. Follow these instructions at home: Medicines  Take over-the-counter and prescription medicines only as told by your doctor. Some medicines are safe and some medicines are not safe during pregnancy.  Take a prenatal vitamin that contains at least 600 micrograms (mcg) of folic acid.  If you have trouble pooping (constipation), take medicine that will make your stool soft (stool softener) if your doctor approves. Eating and drinking   Eat regular, healthy meals.  Avoid raw meat and uncooked cheese.  If you get low calcium from the food you eat, talk to your doctor about taking a daily calcium supplement.  Avoid foods that are high in fat and sugars, such as fried and sweet foods.  If you feel sick to your stomach (nauseous) or throw up (vomit): ? Eat 4 or 5 small meals a day instead of 3 large meals. ? Try eating a few soda crackers. ? Drink liquids between meals instead of during meals.  To prevent constipation: ? Eat foods that are high in fiber, like fresh fruits and vegetables, whole grains, and beans. ? Drink enough fluids to keep your pee (urine) clear or pale yellow. Activity  Exercise only as told by your doctor. Stop exercising if you start to have cramps.  Do not exercise if it is too hot, too humid, or if you are in a place of great height (high altitude).  Avoid heavy lifting.  Wear low-heeled shoes. Sit and stand up straight.  You can continue to have sex unless your doctor tells you not  to. Relieving pain and discomfort  Wear a good support bra if your breasts are tender.  Take warm water baths (sitz baths) to soothe pain or discomfort caused by hemorrhoids. Use hemorrhoid cream if your doctor approves.  Rest with your legs raised if you have leg cramps or low back pain.  If you develop puffy, bulging veins (varicose veins) in your legs: ? Wear support hose or compression stockings as told by your doctor. ? Raise (elevate) your feet for 15 minutes, 3-4 times a day. ? Limit salt in your food. Prenatal care  Write down your questions. Take them to your prenatal visits.  Keep all your prenatal visits as told by your doctor. This is important. Safety  Wear your seat belt when driving.  Make a list of emergency phone numbers, including numbers for family, friends, the hospital, and police and fire departments. General instructions  Ask your doctor about the right foods to eat or for help finding a counselor, if you need these services.  Ask your doctor about local prenatal classes. Begin classes before month 6 of your pregnancy.  Do not use hot tubs, steam rooms, or saunas.  Do not douche or use tampons or scented sanitary pads.  Do not cross your legs for long periods of time.  Visit your dentist if you have not done so. Use a soft toothbrush to brush your teeth. Floss gently.  Avoid all smoking, herbs,   and alcohol. Avoid drugs that are not approved by your doctor.  Do not use any products that contain nicotine or tobacco, such as cigarettes and e-cigarettes. If you need help quitting, ask your doctor.  Avoid cat litter boxes and soil used by cats. These carry germs that can cause birth defects in the baby and can cause a loss of your baby (miscarriage) or stillbirth. Contact a doctor if:  You have mild cramps or pressure in your lower belly.  You have pain when you pee (urinate).  You have bad smelling fluid coming from your vagina.  You continue to  feel sick to your stomach (nauseous), throw up (vomit), or have watery poop (diarrhea).  You have a nagging pain in your belly area.  You feel dizzy. Get help right away if:  You have a fever.  You are leaking fluid from your vagina.  You have spotting or bleeding from your vagina.  You have severe belly cramping or pain.  You lose or gain weight rapidly.  You have trouble catching your breath and have chest pain.  You notice sudden or extreme puffiness (swelling) of your face, hands, ankles, feet, or legs.  You have not felt the baby move in over an hour.  You have severe headaches that do not go away when you take medicine.  You have trouble seeing. Summary  The second trimester is from week 14 through week 27 (months 4 through 6). This is often the time in pregnancy that you feel your best.  To take care of yourself and your unborn baby, you will need to eat healthy meals, take medicines only if your doctor tells you to do so, and do activities that are safe for you and your baby.  Call your doctor if you get sick or if you notice anything unusual about your pregnancy. Also, call your doctor if you need help with the right food to eat, or if you want to know what activities are safe for you. This information is not intended to replace advice given to you by your health care provider. Make sure you discuss any questions you have with your health care provider. Document Revised: 09/17/2018 Document Reviewed: 07/01/2016 Elsevier Patient Education  2020 [employer] Inc[employer]  Safe Medications in Pregnancy   Acne: Benzoyl Peroxide Salicylic Acid  Backache/Headache: Tylenol: 2 regular strength every 4 hours OR              2 Extra strength every 6 hours  Colds/Coughs/Allergies: Benadryl (alcohol free) 25 mg every 6 hours as needed Breath right strips Claritin Cepacol throat lozenges Chloraseptic throat spray Cold-Eeze- up to three times per day Cough drops, alcohol  free Flonase (by prescription only) Guaifenesin Mucinex Robitussin DM (plain only, alcohol free) Saline nasal spray/drops Sudafed (pseudoephedrine) & Actifed ** use only after [redacted] weeks gestation and if you do not have high blood pressure Tylenol Vicks Vaporub Zinc lozenges Zyrtec   Constipation: Colace Ducolax suppositories Fleet enema Glycerin suppositories Metamucil Milk of magnesia Miralax Senokot Smooth move tea  Diarrhea: Kaopectate Imodium A-D  *NO pepto Bismol  Hemorrhoids: Anusol Anusol HC Preparation H Tucks  Indigestion: Tums Maalox Mylanta Zantac  Pepcid  Insomnia: Benadryl (alcohol free) 25mg every 6 hours as needed Tylenol PM Unisom, no Gelcaps  Leg Cramps: Tums MagGel  Nausea/Vomiting:  Bonine Dramamine Emetrol Ginger extract Sea bands Meclizine  Nausea medication to take during pregnancy:  Unisom (doxylamine succinate 25 mg tablets) Take one tablet daily at bedtime. If symptoms are   not adequately controlled, the dose can be increased to a maximum recommended dose of two tablets daily (1/2 tablet in the morning, 1/2 tablet mid-afternoon and one at bedtime). Vitamin B6 100mg tablets. Take one tablet twice a day (up to 200 mg per day).  Skin Rashes: Aveeno products Benadryl cream or 25mg every 6 hours as needed Calamine Lotion 1% cortisone cream  Yeast infection: Gyne-lotrimin 7 Monistat 7   **If taking multiple medications, please check labels to avoid duplicating the same active ingredients **take medication as directed on the label ** Do not exceed 4000 mg of tylenol in 24 hours **Do not take medications that contain aspirin or ibuprofen     

## 2019-11-22 NOTE — Progress Notes (Signed)
History:   Abigail Hardin is a 29 y.o. [id number] at 75w3dby LMP being seen today for her first obstetrical visit.  Her obstetrical history is significant for CHTN, obesity, hx of PTD, hx of GDM. Patient does intend to breast feed. Pregnancy history fully reviewed.  Patient reports backache, nausea and vomiting.     HISTORY: OB History  Gravida Para Term Preterm AB Living  _0 SAB TAB Ectopic Multiple Live Births  1 2 0 0 2    # Outcome Date GA Lbr Len/2nd Weight Sex Delivery Anes PTL Lv  6 Current           5 SAB 03/10/19     SAB     4 Term 08/18/10 451w0d F Vag-Spont   LIV  3 Preterm 02/17/08 [id number] Vag-Spont   LIV  2 TAB           1 TAB             Last pap smear was done 2019 and was abnormal - per patient  Past Medical History:  Diagnosis Date  . Hypertension    does not currently take meds   Past Surgical History:  Procedure Laterality Date  . DILATION AND CURETTAGE OF UTERUS    . INDUCED ABORTION  04/09/2011  . TONSILLECTOMY     Family History  Problem Relation Age of Onset  . Asthma Daughter   . Asthma Son   . Hypertension Maternal Aunt    Social History   Tobacco Use  . Smoking status: Former Smoker    Packs/day: 0.25    Years: 18.00    Pack years: 4.50    Types: Cigarettes    Quit date: 02/2019    Years since quitting: 0.7  . Smokeless tobacco: Never Used  Vaping Use  . Vaping Use: Never used  Substance Use Topics  . Alcohol use: Not Currently  . Drug use: No   No Known Allergies Current Outpatient Medications on File Prior to Visit  Medication Sig Dispense Refill  . Prenatal Vit-Fe Fumarate-FA (MULTIVITAMIN-PRENATAL) 27-0.8 MG TABS tablet Take 1 tablet by mouth daily at 12 noon.    . sertraline (ZOLOFT) 100 MG tablet Take 100 mg by mouth daily.    . Blood Pressure Monitor DEVI Please check blood pressure 1-2 times per week. 1 each 0  . prenatal vitamin w/FE, FA (PRENATAL 1 + 1) 27-1 MG TABS tablet Take 1 tablet by mouth daily at  12 noon. 30 tablet 0   No current facility-administered medications on file prior to visit.    Review of Systems Pertinent items noted in HPI and remainder of comprehensive ROS otherwise negative. Physical Exam:   Vitals:   11/22/19 0902  BP: 116/67  Pulse: 86  Weight: 224 lb (101.6 kg)   Fetal Heart Rate (bpm): 150  Pelvic Exam: Perineum: no hemorrhoids, normal perineum   Vulva: normal external genitalia, no lesions   Vagina:  normal mucosa, normal discharge   Cervix: no lesions and normal, pap smear done.    Adnexa: normal adnexa and no mass, fullness, tenderness   Bony Pelvis: average  System: General: well-developed, obese female in no acute distress   Breasts:  normal appearance, no masses or tenderness bilaterally, bilateral nipple piecing present    Skin: normal coloration and turgor, no rashes   Neurologic: oriented, normal, negative, normal mood   Extremities: normal strength, tone, and muscle mass,  ROM of all joints is normal   HEENT PERRLA, extraocular movement intact and sclera clear   Mouth/Teeth mucous membranes moist, pharynx normal without lesions and dental hygiene good   Neck supple and no masses   Cardiovascular: regular rate and rhythm   Respiratory:  no respiratory distress, normal breath sounds   Abdomen: soft, non-tender; bowel sounds normal; no masses,  no organomegaly    Assessment:    Pregnancy: [id number] Patient Active Problem List   Diagnosis Date Noted  . Obesity affecting pregnancy, antepartum 11/22/2019  . Hx of preterm delivery, currently pregnant 11/22/2019  . Chronic hypertension affecting pregnancy 11/22/2019  . Hx of gestational diabetes in prior pregnancy, currently pregnant 11/22/2019  . Depression affecting pregnancy 11/22/2019  . Supervision of other normal pregnancy, antepartum 11/14/2019     Plan:    1. Supervision of other normal pregnancy, antepartum - Welcomed to practice and introduced self to patient  - Reviewed safety,  visitor policy, reassurance about COVID-19 for pregnancy at this time. Discussed possible changes to visits, including televisits, that may occur due to COVID-19.  The office remains open if pt needs to be seen and MAU is open 24 hours/day for OB emergencies. - Anticipatory guidance on upcoming appointments - Safe medications to take in pregnancy discussed  - Discussed with patient that this is considered high risk pregnancy based on history  - Cytology - PAP( [place]) - Cervicovaginal ancillary only( [place]) - Culture, OB Urine - Genetic Screening - CBC/D/Plt+RPR+Rh+ABO+Rub Ab... - [place] MFM OB DETAIL +14 WK; Future  2. Obesity affecting pregnancy, antepartum - bASA initiated today  - BMI 33.28  - Hemoglobin A1c - Comp Met (CMET) - Protein / creatinine ratio, urine - [place] MFM OB DETAIL +14 WK; Future - aspirin EC 81 MG tablet; Take 1 tablet (81 mg total) by mouth daily. Take after 12 weeks for prevention of preeclampsia later in pregnancy  Dispense: 300 tablet; Refill: 2  3. Hx of preterm delivery, currently pregnant - Patient reports hx of PTL and PTD at 36 weeks with first child  - Denies PROM, reports contractions and labor that led to PTD  - Educated and discussed 17P with patient to be initiated at 16 weeks and continued weekly until 36 weeks in order to help prevent PTD. Patient reports that she does not believe she needs it, declines at this time but will think about it.  - Discussed with patient that we will reassess at next prenatal appointment  - [place] MFM OB DETAIL +14 [place]; Future  4. Chronic hypertension affecting pregnancy - Patient reports previous diagnoses of HTN, reports having HTN "majority of my life"  - Currently not on any medication for HTN  - Normotensive BP today  - aspirin EC 81 MG tablet; Take 1 tablet (81 mg total) by mouth daily. Take after 12 weeks for prevention of preeclampsia later in pregnancy  Dispense: 300 tablet; Refill: 2  5. Hx of gestational  diabetes in prior pregnancy, currently pregnant - with first pregnancy, no GDM with second pregnancy  - A1c obtained today   6. Nausea and vomiting during pregnancy - ondansetron (ZOFRAN ODT) 4 MG disintegrating tablet; Take 1 tablet (4 mg total) by mouth every 8 (eight) hours as needed for nausea or vomiting.  Dispense: 30 tablet; Refill: 1  7. Depression affecting pregnancy - Patient has hx of depression, currently on 147m Zoloft    Initial labs drawn. Continue prenatal vitamins. Problem list reviewed and updated. Genetic Screening  discussed, NIPS: ordered. Ultrasound discussed; fetal anatomic survey: ordered. Discussed usage of [employer] and virtual visits as additional source of managing and completing prenatal visits in midst of coronavirus and pandemic.   Anticipatory guidance for prenatal visits including labs, ultrasounds, and testing; Initial labs drawn. Encouraged to complete MyChart Registration for her ability to review results, send requests, and have questions addressed.  The nature of [place] [facility] with multiple MDs and Advanced Practice Providers was explained to patient; also emphasized that residents, students are part of our team. Routine obstetric precautions reviewed. Encouraged to seek out care at office or emergency room [facility] MAU preferred) for urgent and/or emergent concerns. Return in about 4 weeks (around 12/20/2019) for HROB/AFP- discuss makena injections again .      Lajean Manes, [place] for [employer], [place]

## 2019-11-22 NOTE — Progress Notes (Signed)
Pt is having nausea, leg cramps, back pain and constipation.

## 2019-11-23 LAB — PROTEIN / CREATININE RATIO, URINE
Creatinine, Urine: 121.1 mg/dL
Protein, Ur: 15 mg/dL
Protein/Creat Ratio: 124 mg/g creat (ref 0–200)

## 2019-11-23 LAB — CYTOLOGY - PAP
Comment: NEGATIVE
Diagnosis: NEGATIVE
High risk HPV: NEGATIVE

## 2019-11-23 LAB — CBC/D/PLT+RPR+RH+ABO+RUB AB...
Antibody Screen: NEGATIVE
Basophils Absolute: 0 10*3/uL (ref 0.0–0.2)
Basos: 0 %
EOS (ABSOLUTE): 0.1 10*3/uL (ref 0.0–0.4)
Eos: 1 %
HCV Ab: <0.1 s/co ratio (ref 0.0–0.9)
HIV Screen 4th Generation wRfx: NONREACTIVE
Hematocrit: 37.5 % (ref 34.0–46.6)
Hemoglobin: 12.6 g/dL (ref 11.1–15.9)
Hepatitis B Surface Ag: NEGATIVE
Immature Grans (Abs): 0 10*3/uL (ref 0.0–0.1)
Immature Granulocytes: 0 %
Lymphocytes Absolute: 1.8 10*3/uL (ref 0.7–3.1)
Lymphs: 23 %
MCH: 31.3 pg (ref 26.6–33.0)
MCHC: 33.6 g/dL (ref 31.5–35.7)
MCV: 93 fL (ref 79–97)
Monocytes Absolute: 0.5 10*3/uL (ref 0.1–0.9)
Monocytes: 7 %
Neutrophils Absolute: 5.4 10*3/uL (ref 1.4–7.0)
Neutrophils: 69 %
Platelets: 210 10*3/uL (ref 150–450)
RBC: 4.02 x10E6/uL (ref 3.77–5.28)
RDW: 12.1 % (ref 11.7–15.4)
RPR Ser Ql: NONREACTIVE
Rh Factor: POSITIVE
Rubella Antibodies, IGG: 6.73 index (ref >0.99)
WBC: 7.8 10*3/uL (ref 3.4–10.8)

## 2019-11-23 LAB — COMPREHENSIVE METABOLIC PANEL
ALT: 12 IU/L (ref 0–32)
AST: 16 IU/L (ref 0–40)
Albumin/Globulin Ratio: 1.4 (ref 1.2–2.2)
Albumin: 3.8 g/dL — ABNORMAL LOW (ref 3.9–5.0)
Alkaline Phosphatase: 86 IU/L (ref 48–121)
BUN/Creatinine Ratio: 16 (ref 9–23)
BUN: 9 mg/dL (ref 6–20)
Bilirubin Total: 0.4 mg/dL (ref 0.0–1.2)
CO2: 23 mmol/L (ref 20–29)
Calcium: 9.2 mg/dL (ref 8.7–10.2)
Chloride: 101 mmol/L (ref 96–106)
Creatinine, Ser: 0.57 mg/dL (ref 0.57–1.00)
GFR calc Af Amer: 144 mL/min/{1.73_m2} (ref >59)
GFR calc non Af Amer: 125 mL/min/{1.73_m2} (ref >59)
Globulin, Total: 2.7 g/dL (ref 1.5–4.5)
Glucose: 86 mg/dL (ref 65–99)
Potassium: 4 mmol/L (ref 3.5–5.2)
Sodium: 141 mmol/L (ref 134–144)
Total Protein: 6.5 g/dL (ref 6.0–8.5)

## 2019-11-23 LAB — CERVICOVAGINAL ANCILLARY ONLY
Bacterial Vaginitis (gardnerella): NEGATIVE
Candida Glabrata: NEGATIVE
Candida Vaginitis: NEGATIVE
Chlamydia: NEGATIVE
Comment: NEGATIVE
Comment: NEGATIVE
Comment: NEGATIVE
Comment: NEGATIVE
Comment: NEGATIVE
Comment: NORMAL
Neisseria Gonorrhea: NEGATIVE
Trichomonas: NEGATIVE

## 2019-11-23 LAB — HCV INTERPRETATION

## 2019-11-23 LAB — HEMOGLOBIN A1C
Est. average glucose Bld gHb Est-mCnc: 97 mg/dL
Hgb A1c MFr Bld: 5 % (ref 4.8–5.6)

## 2019-11-25 LAB — CULTURE, OB URINE

## 2019-11-25 LAB — URINE CULTURE, OB REFLEX

## 2019-11-28 ENCOUNTER — Encounter: Payer: Self-pay | Admitting: Certified Nurse Midwife

## 2019-11-29 ENCOUNTER — Encounter: Payer: Self-pay | Admitting: Certified Nurse Midwife

## 2019-11-29 DIAGNOSIS — D573 Sickle-cell trait: Secondary | ICD-10-CM | POA: Insufficient documentation

## 2019-11-30 ENCOUNTER — Encounter: Payer: Self-pay | Admitting: Certified Nurse Midwife

## 2019-12-07 ENCOUNTER — Encounter: Payer: Self-pay | Admitting: Certified Nurse Midwife

## 2019-12-16 ENCOUNTER — Ambulatory Visit: Payer: Self-pay

## 2019-12-16 ENCOUNTER — Inpatient Hospital Stay (HOSPITAL_COMMUNITY)
Admission: AD | Admit: 2019-12-16 | Discharge: 2019-12-16 | Disposition: A | Discharge status: Home / Self Care | Payer: Medicaid Other | Attending: Family Medicine | Admitting: Family Medicine

## 2019-12-16 ENCOUNTER — Other Ambulatory Visit: Payer: Self-pay

## 2019-12-16 ENCOUNTER — Encounter (HOSPITAL_COMMUNITY): Payer: Self-pay | Admitting: Family Medicine

## 2019-12-16 DIAGNOSIS — Z87891 Personal history of nicotine dependence: Secondary | ICD-10-CM | POA: Diagnosis not present

## 2019-12-16 DIAGNOSIS — D573 Sickle-cell trait: Secondary | ICD-10-CM | POA: Diagnosis not present

## 2019-12-16 DIAGNOSIS — F419 Anxiety disorder, unspecified: Secondary | ICD-10-CM | POA: Insufficient documentation

## 2019-12-16 DIAGNOSIS — O209 Hemorrhage in early pregnancy, unspecified: Secondary | ICD-10-CM | POA: Insufficient documentation

## 2019-12-16 DIAGNOSIS — O99342 Other mental disorders complicating pregnancy, second trimester: Secondary | ICD-10-CM | POA: Insufficient documentation

## 2019-12-16 DIAGNOSIS — Z79899 Other long term (current) drug therapy: Secondary | ICD-10-CM | POA: Diagnosis not present

## 2019-12-16 DIAGNOSIS — Z7982 Long term (current) use of aspirin: Secondary | ICD-10-CM | POA: Diagnosis not present

## 2019-12-16 DIAGNOSIS — O26892 Other specified pregnancy related conditions, second trimester: Secondary | ICD-10-CM | POA: Insufficient documentation

## 2019-12-16 DIAGNOSIS — K5900 Constipation, unspecified: Secondary | ICD-10-CM | POA: Diagnosis not present

## 2019-12-16 DIAGNOSIS — O99012 Anemia complicating pregnancy, second trimester: Secondary | ICD-10-CM | POA: Insufficient documentation

## 2019-12-16 DIAGNOSIS — O99612 Diseases of the digestive system complicating pregnancy, second trimester: Secondary | ICD-10-CM

## 2019-12-16 DIAGNOSIS — Z3480 Encounter for supervision of other normal pregnancy, unspecified trimester: Secondary | ICD-10-CM

## 2019-12-16 DIAGNOSIS — R102 Pelvic and perineal pain: Secondary | ICD-10-CM | POA: Insufficient documentation

## 2019-12-16 DIAGNOSIS — N939 Abnormal uterine and vaginal bleeding, unspecified: Secondary | ICD-10-CM

## 2019-12-16 DIAGNOSIS — Z3A15 15 weeks gestation of pregnancy: Secondary | ICD-10-CM | POA: Insufficient documentation

## 2019-12-16 HISTORY — DX: Anxiety disorder, unspecified: F41.9

## 2019-12-16 LAB — URINALYSIS, ROUTINE W REFLEX MICROSCOPIC
Bacteria, UA: NONE SEEN
Bilirubin Urine: NEGATIVE
Glucose, UA: NEGATIVE mg/dL
Ketones, ur: NEGATIVE mg/dL
Leukocytes,Ua: NEGATIVE
Nitrite: NEGATIVE
Protein, ur: NEGATIVE mg/dL
RBC / HPF: >50 RBC/hpf — ABNORMAL HIGH (ref 0–5)
Specific Gravity, Urine: 1.014 (ref 1.005–1.030)
pH: 6 (ref 5.0–8.0)

## 2019-12-16 LAB — WET PREP, GENITAL
Clue Cells Wet Prep HPF POC: NONE SEEN
Sperm: NONE SEEN
Trich, Wet Prep: NONE SEEN
Yeast Wet Prep HPF POC: NONE SEEN

## 2019-12-16 NOTE — MAU Note (Addendum)
Around 4, noted blood when she wiped, small amt and small clot.  Has not seen any since. Had a sharp pain across lower abd around 5:15, has since passed.  Has been constipated, was straining some when went to the bathroom and had the bleeding, though pt did not have a bm

## 2019-12-16 NOTE — MAU Provider Note (Signed)
Chief Complaint:  Vaginal Bleeding   First Provider Initiated Contact with Patient 12/16/19 1920     HPI: Abigail Hardin is a 29 y.o. [id number] at [redacted]w[redacted]d who presents to maternity admissions reporting possible vaginal bleeding. Pt had one episode of a small clot and spotting when she wiped this afternoon. She has had no other bleeding since, and reported one episode of lower abd/pelvic pain she attributes to gas on the way here, but none at this time. She denies leaking of fluid, decreased fetal movement, fever, falls, or recent illness.   Pregnancy Course: Unremarkable other than n/v   Past Medical History:  Diagnosis Date  . Anxiety   . Hypertension    does not currently take meds   OB History  Gravida Para Term Preterm AB Living  6 2 1 1 3 2   SAB TAB Ectopic Multiple Live Births  1 2 0 0 2    # Outcome Date GA Lbr Len/2nd Weight Sex Delivery Anes PTL Lv  6 Current           5 SAB 03/10/19     SAB     4 Term 08/18/10 [redacted]w[redacted]d   F Vag-Spont   LIV  3 Preterm 02/17/08 [redacted]w[redacted]d   M Vag-Spont   LIV  2 TAB           1 TAB            Past Surgical History:  Procedure Laterality Date  . DILATION AND CURETTAGE OF UTERUS    . INDUCED ABORTION  04/09/2011  . TONSILLECTOMY     Family History  Problem Relation Age of Onset  . Asthma Daughter   . Asthma Son   . Hypertension Maternal Aunt    Social History   Tobacco Use  . Smoking status: Former Smoker    Packs/day: 0.25    Years: 18.00    Pack years: 4.50    Types: Cigarettes    Quit date: 02/2019    Years since quitting: 0.8  . Smokeless tobacco: Never Used  Vaping Use  . Vaping Use: Never used  Substance Use Topics  . Alcohol use: Not Currently  . Drug use: No   No Known Allergies Medications Prior to Admission  Medication Sig Dispense Refill Last Dose  . aspirin EC 81 MG tablet Take 1 tablet (81 mg total) by mouth daily. Take after 12 weeks for prevention of preeclampsia later in pregnancy 300 tablet 2 12/16/2019 at Unknown  time  . Blood Pressure Monitor DEVI Please check blood pressure 1-2 times per week. 1 each 0 12/16/2019 at Unknown time  . ondansetron (ZOFRAN ODT) 4 MG disintegrating tablet Take 1 tablet (4 mg total) by mouth every 8 (eight) hours as needed for nausea or vomiting. 30 tablet 1 Past Month at Unknown time  . Prenatal Vit-Fe Fumarate-FA (MULTIVITAMIN-PRENATAL) 27-0.8 MG TABS tablet Take 1 tablet by mouth daily at 12 noon.   12/16/2019 at Unknown time  . prenatal vitamin w/FE, FA (PRENATAL 1 + 1) 27-1 MG TABS tablet Take 1 tablet by mouth daily at 12 noon. 30 tablet 0 12/16/2019 at Unknown time  . sertraline (ZOLOFT) 100 MG tablet Take 100 mg by mouth daily.   12/15/2019 at Unknown time    I have reviewed patient's Past Medical Hx, Surgical Hx, Family Hx, Social Hx, medications and allergies.   ROS:  Review of Systems  Constitutional: Negative for appetite change, fatigue and fever.  HENT: Negative.   Eyes: Negative  for photophobia and visual disturbance.  Respiratory: Negative for cough, chest tightness and shortness of breath.   Cardiovascular: Negative for chest pain and palpitations.  Gastrointestinal: Positive for anal bleeding (possible bc she has been constipated), constipation and nausea. Negative for abdominal pain, blood in stool, diarrhea and vomiting.  Endocrine: Negative.   Genitourinary: Negative for decreased urine volume, pelvic pain, urgency, vaginal bleeding, vaginal discharge and vaginal pain.  Musculoskeletal: Negative.   Skin: Negative.   Hematological: Negative.   Psychiatric/Behavioral: Negative.     Physical Exam   Patient Vitals for the past 24 hrs:  BP Temp Temp src Pulse Resp Height Weight  12/16/19 1938 136/72 -- -- 71 20 -- --  12/16/19 1850 137/80 98.5 F (36.9 C) Oral 72 18 5\' 5"  (1.651 m) 228 lb 3.2 oz (103.5 kg)    Constitutional: Well-developed, well-nourished female in no acute distress.  Cardiovascular: normal rate & rhythm, no murmur Respiratory: normal  effort, lung sounds clear throughout GI: Abd soft, non-tender, gravid appropriate for gestational age. Pos BS x 4 MS: Extremities nontender, no edema, normal ROM Neurologic: Alert and oriented x 4.  Pelvic: NEFG, physiologic discharge, no blood, cervix clean, no CMT. Anal tag noted with very small hemorrhoid.   Dilation: Closed Effacement (%): Thick Exam by:: j.Dexter Signor, cnm  FHR: 140 with movement noted    Labs: None   Imaging:  None   MAU Course: Orders Placed This Encounter  Procedures  . Wet prep, genital  . Urinalysis, Routine w reflex microscopic  . Discharge patient   MDM: No blood noted on speculum exam, cervix closed. Differential diagnosis - vaginal bleeding vs constipation, no urgent or emergent complaint with FHT and movement detected. Wet prep, gc/ct pending - will send MyChart to patient when results back.  Assessment: 1. Constipation during pregnancy in second trimester   2. Sickle cell trait (HCC)   3. Supervision of other normal pregnancy, antepartum   4. Complaint of vaginal bleeding     Plan: Discharge home in stable condition.  Gave instructions for maintaining healthy bowel elimination. Preterm labor precautions given.   Follow-up Information    [facility]. Go to.   Specialty: Obstetrics and Gynecology Why: as scheduled Contact information: [address] [place] [phone]              Allergies as of 12/16/2019   No Known Allergies     Medication List    TAKE these medications   aspirin EC 81 MG tablet Take 1 tablet (81 mg total) by mouth daily. Take after 12 weeks for prevention of preeclampsia later in pregnancy   Blood Pressure Monitor Devi Please check blood pressure 1-2 times per week.   multivitamin-prenatal 27-0.8 MG Tabs tablet Take 1 tablet by mouth daily at 12 noon.   prenatal vitamin w/FE, FA 27-1 MG Tabs tablet Take 1 tablet by mouth daily at  12 noon.   ondansetron 4 MG disintegrating tablet Commonly known as: Zofran ODT Take 1 tablet (4 mg total) by mouth every 8 (eight) hours as needed for nausea or vomiting.   sertraline 100 MG tablet Commonly known as: ZOLOFT Take 100 mg by mouth daily.       02/16/2020, Bernerd Limbo 12/16/2019 7:46 PM

## 2019-12-16 NOTE — Telephone Encounter (Signed)
  Patient called because she is [redacted] weeks pregnant tomorrow.  Today after going to the bathroom she noticed a small amount of blood on the tissue. She states it was a very small clot. No tissue was noted. She has had intermittent cramping but nothing strong. She has been back to the restroom and seen no blood. She has called her OBGYN but they are closed.Per protocol she will go to UC or Er for evaluation. Care advice read to patient. Patient verbalized understanding. Reason for Disposition . [1] Intermittent lower abdominal pain (e.g., cramping) AND [2] present > 24 hours  Answer Assessment - Initial Assessment Questions 1. ONSET: "When did this bleeding start?"       today 2. DESCRIPTION: "Describe the bleeding that you are having." "How much bleeding is there?"    - SPOTTING: spotting, or pinkish / brownish mucous discharge; does not fill panti-liner or pad    - MILD:  less than 1 pad / hour; less than patient's usual menstrual bleeding   - MODERATE: 1-2 pads / hour; 1 menstrual cup every 6 hours; small-medium blood clots (e.g., pea, grape, small coin)   - SEVERE: soaking 2 or more pads/hour for 2 or more hours; 1 menstrual cup every 2 hours; bleeding not contained by pads or continuous red blood from vagina; large blood clots (e.g., golf ball, large coin)      Small bit on toilet paper 3. ABDOMINAL PAIN SEVERITY: If present, ask: "How bad is it?"  (e.g., Scale 1-10; mild, moderate, or severe)   - MILD (1-3): doesn't interfere with normal activities, abdomen soft and not tender to touch    - MODERATE (4-7): interferes with normal activities or awakens from sleep, tender to touch    - SEVERE (8-10): excruciating pain, doubled over, unable to do any normal activities    Feels heavy 4. PREGNANCY: "Do you know how many weeks or months pregnant you are?" "When was the first day of your last normal menstrual period?"     16 week tomorrow 5. HEMODYNAMIC STATUS: "Are you weak or feeling lightheaded?"  If Yes, ask: "Can you stand and walk normally?"      normal 6. OTHER SYMPTOMS: "What other symptoms are you having with the bleeding?" (e.g., passed tissue, vaginal discharge, fever, menstrual-type cramps)     Small clot  Protocols used: PREGNANCY - VAGINAL BLEEDING LESS THAN [redacted] WEEKS EGA-A-AH

## 2019-12-16 NOTE — Discharge Instructions (Signed)
You have constipation which is hard stools that are difficult to pass. It is important to have regular bowel movements every 1-3 days that are soft and easy to pass. Hard stools increase your risk of hemorrhoids and are very uncomfortable.   To prevent constipation you can increase the amount of fiber in your diet. Examples of foods with fiber are leafy greens, whole grain breads, oatmeal and other grains.  It is also important to drink at least eight 8oz glass of water everyday.   If you have not has a bowel movement in 4-5 days you made need to clean out your bowel.  This will have establish normal movement through your bowel.    Miralax Clean out  Take 8 capfuls of miralax in 64 oz of gatorade. You can use any fluid that appeals to you (gatorade, water, juice)  Continue to drink at least eight 8 oz glasses of water throughout the day  You can repeat with another 8 capfuls of miralax in 64 oz of gatorade if you are not having a large amount of stools  You will need to be at home and close to a bathroom for about 8 hours when you do the above as you may need to go to the bathroom frequently.   After you are cleaned out: - Start Colace100mg  twice daily - Start Miralax once daily - Start a daily fiber supplement like metamucil or citrucel - You can safely use enemas in pregnancy  - if you are having diarrhea you can reduce to Colace once a day or miralax every other day or a 1/2 capful daily.    Second Trimester of Pregnancy  The second trimester is from week 14 through week 27 (month 4 through 6). This is often the time in pregnancy that you feel your best. Often times, morning sickness has lessened or quit. You may have more energy, and you may get hungry more often. Your unborn baby is growing rapidly. At the end of the sixth month, he or she is about 9 inches long and weighs about 1 pounds. You will likely feel the baby move between 18 and 20 weeks of pregnancy. Follow these  instructions at home: Medicines  Take over-the-counter and prescription medicines only as told by your doctor. Some medicines are safe and some medicines are not safe during pregnancy.  Take a prenatal vitamin that contains at least 600 micrograms (mcg) of folic acid.  If you have trouble pooping (constipation), take medicine that will make your stool soft (stool softener) if your doctor approves. Eating and drinking   Eat regular, healthy meals.  Avoid raw meat and uncooked cheese.  If you get low calcium from the food you eat, talk to your doctor about taking a daily calcium supplement.  Avoid foods that are high in fat and sugars, such as fried and sweet foods.  If you feel sick to your stomach (nauseous) or throw up (vomit): ? Eat 4 or 5 small meals a day instead of 3 large meals. ? Try eating a few soda crackers. ? Drink liquids between meals instead of during meals.  To prevent constipation: ? Eat foods that are high in fiber, like fresh fruits and vegetables, whole grains, and beans. ? Drink enough fluids to keep your pee (urine) clear or pale yellow. Activity  Exercise only as told by your doctor. Stop exercising if you start to have cramps.  Do not exercise if it is too hot, too humid, or if you  are in a place of great height (high altitude).  Avoid heavy lifting.  Wear low-heeled shoes. Sit and stand up straight.  You can continue to have sex unless your doctor tells you not to. Relieving pain and discomfort  Wear a good support bra if your breasts are tender.  Take warm water baths (sitz baths) to soothe pain or discomfort caused by hemorrhoids. Use hemorrhoid cream if your doctor approves.  Rest with your legs raised if you have leg cramps or low back pain.  If you develop puffy, bulging veins (varicose veins) in your legs: ? Wear support hose or compression stockings as told by your doctor. ? Raise (elevate) your feet for 15 minutes, 3-4 times a  day. ? Limit salt in your food. Prenatal care  Write down your questions. Take them to your prenatal visits.  Keep all your prenatal visits as told by your doctor. This is important. Safety  Wear your seat belt when driving.  Make a list of emergency phone numbers, including numbers for family, friends, the hospital, and police and fire departments. General instructions  Ask your doctor about the right foods to eat or for help finding a counselor, if you need these services.  Ask your doctor about local prenatal classes. Begin classes before month 6 of your pregnancy.  Do not use hot tubs, steam rooms, or saunas.  Do not douche or use tampons or scented sanitary pads.  Do not cross your legs for long periods of time.  Visit your dentist if you have not done so. Use a soft toothbrush to brush your teeth. Floss gently.  Avoid all smoking, herbs, and alcohol. Avoid drugs that are not approved by your doctor.  Do not use any products that contain nicotine or tobacco, such as cigarettes and e-cigarettes. If you need help quitting, ask your doctor.  Avoid cat litter boxes and soil used by cats. These carry germs that can cause birth defects in the baby and can cause a loss of your baby (miscarriage) or stillbirth. Contact a doctor if:  You have mild cramps or pressure in your lower belly.  You have pain when you pee (urinate).  You have bad smelling fluid coming from your vagina.  You continue to feel sick to your stomach (nauseous), throw up (vomit), or have watery poop (diarrhea).  You have a nagging pain in your belly area.  You feel dizzy. Get help right away if:  You have a fever.  You are leaking fluid from your vagina.  You have spotting or bleeding from your vagina.  You have severe belly cramping or pain.  You lose or gain weight rapidly.  You have trouble catching your breath and have chest pain.  You notice sudden or extreme puffiness (swelling) of your  face, hands, ankles, feet, or legs.  You have not felt the baby move in over an hour.  You have severe headaches that do not go away when you take medicine.  You have trouble seeing. Summary  The second trimester is from week 14 through week 27 (months 4 through 6). This is often the time in pregnancy that you feel your best.  To take care of yourself and your unborn baby, you will need to eat healthy meals, take medicines only if your doctor tells you to do so, and do activities that are safe for you and your baby.  Call your doctor if you get sick or if you notice anything unusual about your pregnancy. Also,  call your doctor if you need help with the right food to eat, or if you want to know what activities are safe for you. This information is not intended to replace advice given to you by your health care provider. Make sure you discuss any questions you have with your health care provider. Document Revised: 09/17/2018 Document Reviewed: 07/01/2016 Elsevier Patient Education  2020 [employer].

## 2019-12-19 LAB — GC/CHLAMYDIA PROBE AMP (~~LOC~~) NOT AT ARMC
Chlamydia: NEGATIVE
Comment: NEGATIVE
Comment: NORMAL
Neisseria Gonorrhea: NEGATIVE

## 2019-12-20 ENCOUNTER — Other Ambulatory Visit: Payer: Self-pay

## 2019-12-20 ENCOUNTER — Telehealth: Payer: Medicaid Other | Admitting: Obstetrics

## 2019-12-20 ENCOUNTER — Ambulatory Visit (INDEPENDENT_AMBULATORY_CARE_PROVIDER_SITE_OTHER): Payer: Medicaid Other | Admitting: Obstetrics and Gynecology

## 2019-12-20 VITALS — BP 136/76 | HR 101 | Wt 223.0 lb

## 2019-12-20 DIAGNOSIS — Z3A16 16 weeks gestation of pregnancy: Secondary | ICD-10-CM

## 2019-12-20 DIAGNOSIS — O285 Abnormal chromosomal and genetic finding on antenatal screening of mother: Secondary | ICD-10-CM | POA: Insufficient documentation

## 2019-12-20 DIAGNOSIS — Z3480 Encounter for supervision of other normal pregnancy, unspecified trimester: Secondary | ICD-10-CM

## 2019-12-20 DIAGNOSIS — O10912 Unspecified pre-existing hypertension complicating pregnancy, second trimester: Secondary | ICD-10-CM

## 2019-12-20 DIAGNOSIS — F32A Depression, unspecified: Secondary | ICD-10-CM

## 2019-12-20 DIAGNOSIS — O99342 Other mental disorders complicating pregnancy, second trimester: Secondary | ICD-10-CM

## 2019-12-20 DIAGNOSIS — F329 Major depressive disorder, single episode, unspecified: Secondary | ICD-10-CM

## 2019-12-20 DIAGNOSIS — D573 Sickle-cell trait: Secondary | ICD-10-CM

## 2019-12-20 DIAGNOSIS — O10919 Unspecified pre-existing hypertension complicating pregnancy, unspecified trimester: Secondary | ICD-10-CM

## 2019-12-20 DIAGNOSIS — Z8632 Personal history of gestational diabetes: Secondary | ICD-10-CM

## 2019-12-20 DIAGNOSIS — O09299 Supervision of pregnancy with other poor reproductive or obstetric history, unspecified trimester: Secondary | ICD-10-CM

## 2019-12-20 NOTE — Progress Notes (Signed)
Pt was recently seen at [facility] for spotting.  Pt states everything was fine.   Pt states she is also having round ligament pain- advised she can take Tylenol and use of heat packs.  Pt states she also needs refills on Zoloft- please discuss.  Please discuss Abigail Hardin results- pt has abnormal Horizon screen.  Pt will need GC consult.

## 2019-12-20 NOTE — Progress Notes (Signed)
   PRENATAL VISIT NOTE  Subjective:  Abigail Hardin is a 29 y.o. [id number] at [redacted]w[redacted]d being seen today for ongoing prenatal care.  She is currently monitored for the following issues for this low-risk pregnancy and has Supervision of other normal pregnancy, antepartum; Obesity affecting pregnancy, antepartum; Hx of preterm delivery, currently pregnant; Chronic hypertension affecting pregnancy; Hx of gestational diabetes in prior pregnancy, currently pregnant; Depression affecting pregnancy; Sickle cell trait (HCC); and Abnormal chromosomal and genetic finding on antenatal screening mother on their problem list.  Patient doing well with no acute concerns today. She reports no complaints.  Contractions: Not present. Vag. Bleeding: Scant.  Movement: Present. Denies leaking of fluid.    BP was WNL today.  Discussed the horizon findings regarding fragile X.  It does not look like this fetus will be affected, but I will still send her for genetic counseling so the findings can be reviewed in detail.  Discussed continued zoloft.  Pt started during covid through a virtual mental health service.  She has had no formal follow up since then.  Advised pt usually SSRI are discontinued in third trimester if possible.  Pt declines stopping zoloft at this time.  Will refer to behavior health for further counseling. The following portions of the patient's history were reviewed and updated as appropriate: allergies, current medications, past family history, past medical history, past social history, past surgical history and problem list. Problem list updated.  Objective:   Vitals:   12/20/19 0903  BP: 136/76  Pulse: (!) 101  Weight: 223 lb (101.2 kg)    Fetal Status: Fetal Heart Rate (bpm): 140   Movement: Present     General:  Alert, oriented and cooperative. Patient is in no acute distress.  Skin: Skin is warm and dry. No rash noted.   Cardiovascular: Normal heart rate noted  Respiratory: Normal respiratory  effort, no problems with respiration noted  Abdomen: Soft, gravid, appropriate for gestational age.  Pain/Pressure: Present     Pelvic: Cervical exam deferred        Extremities: Normal range of motion.     Mental Status:  Normal mood and affect. Normal behavior. Normal judgment and thought content.   Assessment and Plan:  Pregnancy: [id number] at [redacted]w[redacted]d  1. Chronic hypertension affecting pregnancy No HTN today, pt currently on no medications  2. Supervision of other normal pregnancy, antepartum  - AFP, Serum, Open Spina Bifida  3. Hx of gestational diabetes in prior pregnancy, currently pregnant Early A1C WNL  4. Depression affecting pregnancy Continue zoloft for now, awaiting behavioral health recommendations - Ambulatory referral to Integrated Behavioral Health  5. Sickle cell trait (HCC)   6. Abnormal chromosomal and genetic finding on antenatal screening mother  - AMB MFM GENETICS REFERRAL  Preterm labor symptoms and general obstetric precautions including but not limited to vaginal bleeding, contractions, leaking of fluid and fetal movement were reviewed in detail with the patient.  Please refer to After Visit Summary for other counseling recommendations.   Return in about 4 weeks (around 01/17/2020) for ROB.   Mariel Aloe, MD

## 2019-12-20 NOTE — Patient Instructions (Signed)

## 2019-12-22 LAB — AFP, SERUM, OPEN SPINA BIFIDA
AFP MoM: 1.89
AFP Value: 56.4 ng/mL
Gest. Age on Collection Date: 16 weeks
Maternal Age At EDD: 31.2 yr
OSBR Risk 1 IN: 2068
Test Results:: NEGATIVE
Weight: 223 [lb_av]

## 2020-01-04 NOTE — BH Specialist Note (Signed)
Pt did not arrive to video visit and did not answer the phone ; Left HIPPA-compliant message to call back Asher Muir from [facility] [employer] [facility] at [phone] (main office) or [phone] (Vercie Pokorny's office).  ; left MyChart message for patient.    Integrated Behavioral Health via Telemedicine Video (Caregility) Visit  01/04/2020 Ramonita Lab [id number]  Rae Lips

## 2020-01-10 ENCOUNTER — Ambulatory Visit: Payer: Medicaid Other | Admitting: *Deleted

## 2020-01-10 ENCOUNTER — Ambulatory Visit: Payer: Medicaid Other | Attending: Obstetrics and Gynecology

## 2020-01-10 ENCOUNTER — Other Ambulatory Visit: Payer: Self-pay

## 2020-01-10 ENCOUNTER — Other Ambulatory Visit: Payer: Self-pay | Admitting: *Deleted

## 2020-01-10 DIAGNOSIS — O99210 Obesity complicating pregnancy, unspecified trimester: Secondary | ICD-10-CM | POA: Diagnosis present

## 2020-01-10 DIAGNOSIS — O09899 Supervision of other high risk pregnancies, unspecified trimester: Secondary | ICD-10-CM | POA: Diagnosis present

## 2020-01-10 DIAGNOSIS — E669 Obesity, unspecified: Secondary | ICD-10-CM

## 2020-01-10 DIAGNOSIS — O99212 Obesity complicating pregnancy, second trimester: Secondary | ICD-10-CM

## 2020-01-10 DIAGNOSIS — O09892 Supervision of other high risk pregnancies, second trimester: Secondary | ICD-10-CM

## 2020-01-10 DIAGNOSIS — O99012 Anemia complicating pregnancy, second trimester: Secondary | ICD-10-CM

## 2020-01-10 DIAGNOSIS — Z362 Encounter for other antenatal screening follow-up: Secondary | ICD-10-CM

## 2020-01-10 DIAGNOSIS — O99342 Other mental disorders complicating pregnancy, second trimester: Secondary | ICD-10-CM

## 2020-01-10 DIAGNOSIS — O112 Pre-existing hypertension with pre-eclampsia, second trimester: Secondary | ICD-10-CM

## 2020-01-10 DIAGNOSIS — F99 Mental disorder, not otherwise specified: Secondary | ICD-10-CM

## 2020-01-10 DIAGNOSIS — Z3A19 19 weeks gestation of pregnancy: Secondary | ICD-10-CM

## 2020-01-10 DIAGNOSIS — O09299 Supervision of pregnancy with other poor reproductive or obstetric history, unspecified trimester: Secondary | ICD-10-CM

## 2020-01-10 DIAGNOSIS — D573 Sickle-cell trait: Secondary | ICD-10-CM

## 2020-01-10 DIAGNOSIS — Z3480 Encounter for supervision of other normal pregnancy, unspecified trimester: Secondary | ICD-10-CM | POA: Insufficient documentation

## 2020-01-10 NOTE — Progress Notes (Signed)
Denies pain, bleeding or leaking. +FM.

## 2020-01-11 ENCOUNTER — Ambulatory Visit: Payer: Medicaid Other | Admitting: Clinical

## 2020-01-11 DIAGNOSIS — Z91199 Patient's noncompliance with other medical treatment and regimen due to unspecified reason: Secondary | ICD-10-CM

## 2020-01-17 ENCOUNTER — Other Ambulatory Visit: Payer: Self-pay

## 2020-01-17 ENCOUNTER — Encounter: Payer: Self-pay | Admitting: Obstetrics and Gynecology

## 2020-01-17 ENCOUNTER — Ambulatory Visit (INDEPENDENT_AMBULATORY_CARE_PROVIDER_SITE_OTHER): Payer: Medicaid Other | Admitting: Obstetrics and Gynecology

## 2020-01-17 VITALS — BP 118/80 | HR 87 | Wt 229.1 lb

## 2020-01-17 DIAGNOSIS — O285 Abnormal chromosomal and genetic finding on antenatal screening of mother: Secondary | ICD-10-CM

## 2020-01-17 DIAGNOSIS — O0992 Supervision of high risk pregnancy, unspecified, second trimester: Secondary | ICD-10-CM

## 2020-01-17 DIAGNOSIS — Z8632 Personal history of gestational diabetes: Secondary | ICD-10-CM

## 2020-01-17 DIAGNOSIS — O99340 Other mental disorders complicating pregnancy, unspecified trimester: Secondary | ICD-10-CM

## 2020-01-17 DIAGNOSIS — F32A Depression, unspecified: Secondary | ICD-10-CM

## 2020-01-17 DIAGNOSIS — O09299 Supervision of pregnancy with other poor reproductive or obstetric history, unspecified trimester: Secondary | ICD-10-CM

## 2020-01-17 DIAGNOSIS — D573 Sickle-cell trait: Secondary | ICD-10-CM

## 2020-01-17 DIAGNOSIS — F329 Major depressive disorder, single episode, unspecified: Secondary | ICD-10-CM

## 2020-01-17 DIAGNOSIS — O09899 Supervision of other high risk pregnancies, unspecified trimester: Secondary | ICD-10-CM

## 2020-01-17 DIAGNOSIS — O10919 Unspecified pre-existing hypertension complicating pregnancy, unspecified trimester: Secondary | ICD-10-CM

## 2020-01-17 NOTE — Progress Notes (Signed)
   PRENATAL VISIT NOTE  Subjective:  Abigail Hardin is a 29 y.o. [id number] at [redacted]w[redacted]d being seen today for ongoing prenatal care.  She is currently monitored for the following issues for this high-risk pregnancy and has Supervision of other normal pregnancy, antepartum; Obesity affecting pregnancy, antepartum; Hx of preterm delivery, currently pregnant; Chronic hypertension affecting pregnancy; Hx of gestational diabetes in prior pregnancy, currently pregnant; Depression affecting pregnancy; Sickle cell trait (HCC); and Abnormal chromosomal and genetic finding on antenatal screening mother on their problem list.  Patient reports no complaints.  Contractions: Not present. Vag. Bleeding: None.  Movement: Present. Denies leaking of fluid.   The following portions of the patient's history were reviewed and updated as appropriate: allergies, current medications, past family history, past medical history, past social history, past surgical history and problem list.   Objective:   Vitals:   01/17/20 0937  BP: 118/80  Pulse: 87  Weight: 229 lb 1.6 oz (103.9 kg)    Fetal Status: Fetal Heart Rate (bpm): 147 Fundal Height: 21 cm Movement: Present     General:  Alert, oriented and cooperative. Patient is in no acute distress.  Skin: Skin is warm and dry. No rash noted.   Cardiovascular: Normal heart rate noted  Respiratory: Normal respiratory effort, no problems with respiration noted  Abdomen: Soft, gravid, appropriate for gestational age.  Pain/Pressure: Absent     Pelvic: Cervical exam deferred        Extremities: Normal range of motion.     Mental Status: Normal mood and affect. Normal behavior. Normal judgment and thought content.   Assessment and Plan:  Pregnancy: [id number] at [redacted]w[redacted]d 1. Supervision of high risk pregnancy in second trimester - Recent anatomy screen within normal limits.  Patient for repeat scan in 4 weeks to complete anatomy.  2. Chronic hypertension affecting pregnancy -  Continue ASA. - PreEclampsia precautions discussed.  3. Depression affecting pregnancy - Continue Zoloft. - Patient denies any suicidal or homicidal ideations. - She is to reschedule the missed appointment with behavioral health.  4. Hx of preterm delivery, currently pregnant - Patient declines 17P. - Preterm labor precautions discussed.  5. Hx of gestational diabetes in prior pregnancy, currently pregnant - Normal early A1c. - Anticipated 2hr Gtt at 28 weeks.  6. Abnormal chromosomal and genetic finding on antenatal screening mother - Genetic counseling consult pending.  7. Sickle cell trait (HCC) - FOB is not a carrier.    Preterm labor symptoms and general obstetric precautions including but not limited to vaginal bleeding, contractions, leaking of fluid and fetal movement were reviewed in detail with the patient. Please refer to After Visit Summary for other counseling recommendations.   Return in about 4 weeks (around 02/14/2020) for HROB.  Future Appointments  Date Time Provider Department Center  02/07/2020  9:00 AM [facility] NURSE [facility]  02/07/2020  9:15 AM [facility]  02/14/2020  8:45 AM Johnny Bridge, MD [facility] None    Johnny Bridge, MD

## 2020-01-23 ENCOUNTER — Other Ambulatory Visit: Payer: Self-pay | Admitting: Obstetrics and Gynecology

## 2020-01-23 ENCOUNTER — Ambulatory Visit (INDEPENDENT_AMBULATORY_CARE_PROVIDER_SITE_OTHER): Payer: Medicaid Other | Admitting: Licensed Clinical Social Worker

## 2020-01-23 DIAGNOSIS — Z8659 Personal history of other mental and behavioral disorders: Secondary | ICD-10-CM

## 2020-01-23 MED ORDER — SERTRALINE HCL 100 MG PO TABS: 100.0000 mg | ORAL_TABLET | Freq: Every day | ORAL | 3 refills | Status: DC

## 2020-01-23 NOTE — BH Specialist Note (Signed)
Integrated Behavioral Health via Telemedicine Video (Caregility) Visit  01/23/2020 Abigail Hardin [id number]  Number of Integrated Behavioral Health visits: 1 Session Start time: 9:00am  Session End time: 9:12am Total time: 12 minutes via mychart   Referring Provider: L. Donavan Foil MD Type of Visit: Video Patient/Family location: Home  [facility] Provider location: [facility]  All persons participating in visit: Abigail Hardin and LCSWA A. Felton Clinton   Discussed confidentiality: yes  I connected with Abigail Hardin and/or Abigail Hardin's n/a by a video enabled telemedicine application (Caregility) and verified that I am speaking with the correct person using two identifiers.    I discussed that engaging in this virtual visit, they consent to the provision of behavioral healthcare and the services will be billed under their insurance.   Patient and/or legal guardian expressed understanding and consented to virtual visit: yes  PRESENTING CONCERNS: Patient and/or family reports the following symptoms/concerns: history of anxiety Duration of problem: Feb 2021 ; Severity of problem: Mild   STRENGTHS (Protective Factors/Coping Skills): Employed full time   GOALS ADDRESSED: Patient will: 1.  Reduce symptoms of: anxiety 2.  Increase knowledge of diagnosis and implement skills to alleviate symptoms   3.  Demonstrate ability to: self manage symptoms   INTERVENTIONS: Interventions utilized: Supportive Counseling  Standardized Assessments completed:   ASSESSMENT: Patient reports history of anxiety. Abigail Hardin reports she is currently taking zoloft which is prescribed from an online platform provider. Abigail Hardin desires to continue taking zoloft and reports improvement in symptoms unknown to [facility].   Abigail Hardin may continue with prescribed medication and receive medication management from pcp or existing psychiatrist.   PLAN: 1. Follow up with behavioral health clinician on : as needed   2. Behavioral recommendations: Continue with existing mental health provider and take prescribed medication as directed.  3. Referral(s): None   I discussed the assessment and treatment plan with the patient and/or parent/guardian. They were provided an opportunity to ask questions and all were answered. They agreed with the plan and demonstrated an understanding of the instructions.   They were advised to call back or seek an in-person evaluation if the symptoms worsen or if the condition fails to improve as anticipated.   Confirmed patient's address: no  Confirmed patient's phone number: no Any changes to demographics: n/a  Confirmed patient's insurance: no Any changes to patient's insurance: no   I discussed the limitations of evaluation and management by telemedicine and the availability of in person appointments.  I discussed that the purpose of this visit is to provide behavioral health care while limiting exposure to the novel coronavirus.   Discussed there is a possibility of technology failure and discussed alternative modes of communication if that failure occurs.  Gwyndolyn Saxon

## 2020-02-07 ENCOUNTER — Ambulatory Visit: Payer: Medicaid Other | Attending: Obstetrics and Gynecology

## 2020-02-07 ENCOUNTER — Other Ambulatory Visit: Payer: Self-pay | Admitting: *Deleted

## 2020-02-07 ENCOUNTER — Encounter: Payer: Self-pay | Admitting: *Deleted

## 2020-02-07 ENCOUNTER — Ambulatory Visit: Payer: Medicaid Other | Admitting: *Deleted

## 2020-02-07 ENCOUNTER — Other Ambulatory Visit: Payer: Self-pay

## 2020-02-07 DIAGNOSIS — Z362 Encounter for other antenatal screening follow-up: Secondary | ICD-10-CM | POA: Diagnosis present

## 2020-02-07 DIAGNOSIS — D573 Sickle-cell trait: Secondary | ICD-10-CM | POA: Insufficient documentation

## 2020-02-07 DIAGNOSIS — O112 Pre-existing hypertension with pre-eclampsia, second trimester: Secondary | ICD-10-CM

## 2020-02-07 DIAGNOSIS — Z3480 Encounter for supervision of other normal pregnancy, unspecified trimester: Secondary | ICD-10-CM | POA: Insufficient documentation

## 2020-02-07 DIAGNOSIS — O09212 Supervision of pregnancy with history of pre-term labor, second trimester: Secondary | ICD-10-CM

## 2020-02-07 DIAGNOSIS — Z862 Personal history of diseases of the blood and blood-forming organs and certain disorders involving the immune mechanism: Secondary | ICD-10-CM

## 2020-02-07 DIAGNOSIS — O99342 Other mental disorders complicating pregnancy, second trimester: Secondary | ICD-10-CM

## 2020-02-07 DIAGNOSIS — F99 Mental disorder, not otherwise specified: Secondary | ICD-10-CM

## 2020-02-07 DIAGNOSIS — E669 Obesity, unspecified: Secondary | ICD-10-CM

## 2020-02-07 DIAGNOSIS — O99212 Obesity complicating pregnancy, second trimester: Secondary | ICD-10-CM

## 2020-02-07 DIAGNOSIS — O09292 Supervision of pregnancy with other poor reproductive or obstetric history, second trimester: Secondary | ICD-10-CM | POA: Diagnosis not present

## 2020-02-07 DIAGNOSIS — Z3A23 23 weeks gestation of pregnancy: Secondary | ICD-10-CM

## 2020-02-07 DIAGNOSIS — O10919 Unspecified pre-existing hypertension complicating pregnancy, unspecified trimester: Secondary | ICD-10-CM

## 2020-02-14 ENCOUNTER — Encounter: Payer: Medicaid Other | Admitting: Obstetrics and Gynecology

## 2020-02-16 ENCOUNTER — Encounter: Payer: Self-pay | Admitting: Obstetrics

## 2020-02-16 ENCOUNTER — Other Ambulatory Visit: Payer: Self-pay

## 2020-02-16 ENCOUNTER — Ambulatory Visit (INDEPENDENT_AMBULATORY_CARE_PROVIDER_SITE_OTHER): Payer: Medicaid Other | Admitting: Obstetrics

## 2020-02-16 VITALS — BP 113/78 | HR 84 | Wt 234.2 lb

## 2020-02-16 DIAGNOSIS — O0992 Supervision of high risk pregnancy, unspecified, second trimester: Secondary | ICD-10-CM

## 2020-02-16 DIAGNOSIS — Z8632 Personal history of gestational diabetes: Secondary | ICD-10-CM

## 2020-02-16 DIAGNOSIS — Z8659 Personal history of other mental and behavioral disorders: Secondary | ICD-10-CM

## 2020-02-16 DIAGNOSIS — M549 Dorsalgia, unspecified: Secondary | ICD-10-CM

## 2020-02-16 DIAGNOSIS — O26892 Other specified pregnancy related conditions, second trimester: Secondary | ICD-10-CM

## 2020-02-16 DIAGNOSIS — O09899 Supervision of other high risk pregnancies, unspecified trimester: Secondary | ICD-10-CM

## 2020-02-16 DIAGNOSIS — Z3A24 24 weeks gestation of pregnancy: Secondary | ICD-10-CM

## 2020-02-16 DIAGNOSIS — O10919 Unspecified pre-existing hypertension complicating pregnancy, unspecified trimester: Secondary | ICD-10-CM

## 2020-02-16 DIAGNOSIS — O285 Abnormal chromosomal and genetic finding on antenatal screening of mother: Secondary | ICD-10-CM

## 2020-02-16 DIAGNOSIS — O10912 Unspecified pre-existing hypertension complicating pregnancy, second trimester: Secondary | ICD-10-CM

## 2020-02-16 DIAGNOSIS — O99012 Anemia complicating pregnancy, second trimester: Secondary | ICD-10-CM

## 2020-02-16 DIAGNOSIS — O219 Vomiting of pregnancy, unspecified: Secondary | ICD-10-CM

## 2020-02-16 DIAGNOSIS — O09292 Supervision of pregnancy with other poor reproductive or obstetric history, second trimester: Secondary | ICD-10-CM

## 2020-02-16 DIAGNOSIS — D573 Sickle-cell trait: Secondary | ICD-10-CM

## 2020-02-16 MED ORDER — COMFORT FIT MATERNITY SUPP SM MISC: 0 refills | Status: DC

## 2020-02-16 MED ORDER — ONDANSETRON 4 MG PO TBDP: 4.0000 mg | ORAL_TABLET | Freq: Three times a day (TID) | ORAL | 1 refills | Status: DC | PRN

## 2020-02-16 NOTE — Progress Notes (Signed)
Patient reports fetal movement with occasional contractions. Pt states she still has some vomiting in the mornings, request refill on zofran.

## 2020-02-16 NOTE — Progress Notes (Signed)
Subjective:  Abigail Hardin is a 29 y.o. [id number] at [redacted]w[redacted]d being seen today for ongoing prenatal care.  She is currently monitored for the following issues for this high-risk pregnancy and has Supervision of other normal pregnancy, antepartum; Obesity affecting pregnancy, antepartum; Hx of preterm delivery, currently pregnant; Chronic hypertension affecting pregnancy; Hx of gestational diabetes in prior pregnancy, currently pregnant; Depression affecting pregnancy; Sickle cell trait (HCC); and Abnormal chromosomal and genetic finding on antenatal screening mother on their problem list.  Patient reports backache and heartburn.   .  .   . Denies leaking of fluid.   The following portions of the patient's history were reviewed and updated as appropriate: allergies, current medications, past family history, past medical history, past social history, past surgical history and problem list. Problem list updated.  Objective:   Vitals:   02/16/20 0936  BP: 113/78  Pulse: 84  Weight: 234 lb 3.2 oz (106.2 kg)    Fetal Status:           General:  Alert, oriented and cooperative. Patient is in no acute distress.  Skin: Skin is warm and dry. No rash noted.   Cardiovascular: Normal heart rate noted  Respiratory: Normal respiratory effort, no problems with respiration noted  Abdomen: Soft, gravid, appropriate for gestational age.       Pelvic:  Cervical exam deferred        Extremities: Normal range of motion.     Mental Status: Normal mood and affect. Normal behavior. Normal judgment and thought content.   Urinalysis:      Assessment and Plan:  Pregnancy: [id number] at [redacted]w[redacted]d  1. Supervision of high risk pregnancy in second trimester  2. Chronic hypertension affecting pregnancy - clinically stable. Taking Baby ASA.  3. Hx of preterm delivery, currently pregnant  4. Hx of gestational diabetes in prior pregnancy, currently pregnant  5. History of anxiety  6. Sickle cell trait (HCC)  7.  Abnormal chromosomal and genetic finding on antenatal screening mother - intermediate Fragile X  Preterm labor symptoms and general obstetric precautions including but not limited to vaginal bleeding, contractions, leaking of fluid and fetal movement were reviewed in detail with the patient. Please refer to After Visit Summary for other counseling recommendations.   Return in about 4 weeks (around 03/15/2020) for ROB, 2 hour OGTT.   Brock Bad, MD  02/16/20

## 2020-03-06 ENCOUNTER — Ambulatory Visit: Payer: Medicaid Other | Admitting: *Deleted

## 2020-03-06 ENCOUNTER — Other Ambulatory Visit: Payer: Self-pay | Admitting: *Deleted

## 2020-03-06 ENCOUNTER — Ambulatory Visit: Payer: Medicaid Other | Attending: Obstetrics and Gynecology

## 2020-03-06 ENCOUNTER — Other Ambulatory Visit: Payer: Self-pay

## 2020-03-06 ENCOUNTER — Encounter: Payer: Self-pay | Admitting: *Deleted

## 2020-03-06 DIAGNOSIS — Z3480 Encounter for supervision of other normal pregnancy, unspecified trimester: Secondary | ICD-10-CM

## 2020-03-06 DIAGNOSIS — Z148 Genetic carrier of other disease: Secondary | ICD-10-CM

## 2020-03-06 DIAGNOSIS — O99342 Other mental disorders complicating pregnancy, second trimester: Secondary | ICD-10-CM

## 2020-03-06 DIAGNOSIS — F99 Mental disorder, not otherwise specified: Secondary | ICD-10-CM

## 2020-03-06 DIAGNOSIS — O99212 Obesity complicating pregnancy, second trimester: Secondary | ICD-10-CM

## 2020-03-06 DIAGNOSIS — O09212 Supervision of pregnancy with history of pre-term labor, second trimester: Secondary | ICD-10-CM | POA: Diagnosis not present

## 2020-03-06 DIAGNOSIS — O10919 Unspecified pre-existing hypertension complicating pregnancy, unspecified trimester: Secondary | ICD-10-CM

## 2020-03-06 DIAGNOSIS — O10912 Unspecified pre-existing hypertension complicating pregnancy, second trimester: Secondary | ICD-10-CM

## 2020-03-06 DIAGNOSIS — Z3A27 27 weeks gestation of pregnancy: Secondary | ICD-10-CM

## 2020-03-06 DIAGNOSIS — O09292 Supervision of pregnancy with other poor reproductive or obstetric history, second trimester: Secondary | ICD-10-CM

## 2020-03-06 DIAGNOSIS — D573 Sickle-cell trait: Secondary | ICD-10-CM | POA: Insufficient documentation

## 2020-03-06 DIAGNOSIS — E669 Obesity, unspecified: Secondary | ICD-10-CM

## 2020-03-06 DIAGNOSIS — Z862 Personal history of diseases of the blood and blood-forming organs and certain disorders involving the immune mechanism: Secondary | ICD-10-CM

## 2020-03-06 DIAGNOSIS — O321XX0 Maternal care for breech presentation, not applicable or unspecified: Secondary | ICD-10-CM

## 2020-03-15 ENCOUNTER — Other Ambulatory Visit: Payer: Medicaid Other

## 2020-03-15 ENCOUNTER — Encounter: Payer: Medicaid Other | Admitting: Certified Nurse Midwife

## 2020-03-28 ENCOUNTER — Other Ambulatory Visit: Payer: Self-pay

## 2020-03-28 ENCOUNTER — Ambulatory Visit (INDEPENDENT_AMBULATORY_CARE_PROVIDER_SITE_OTHER): Payer: Medicaid Other | Admitting: Obstetrics and Gynecology

## 2020-03-28 ENCOUNTER — Encounter: Payer: Self-pay | Admitting: Obstetrics and Gynecology

## 2020-03-28 ENCOUNTER — Other Ambulatory Visit: Payer: Medicaid Other

## 2020-03-28 VITALS — BP 118/75 | HR 84 | Wt 235.2 lb

## 2020-03-28 DIAGNOSIS — O0992 Supervision of high risk pregnancy, unspecified, second trimester: Secondary | ICD-10-CM

## 2020-03-28 DIAGNOSIS — Z8659 Personal history of other mental and behavioral disorders: Secondary | ICD-10-CM

## 2020-03-28 DIAGNOSIS — Z3A30 30 weeks gestation of pregnancy: Secondary | ICD-10-CM

## 2020-03-28 DIAGNOSIS — O219 Vomiting of pregnancy, unspecified: Secondary | ICD-10-CM

## 2020-03-28 MED ORDER — ONDANSETRON 4 MG PO TBDP: 4.0000 mg | ORAL_TABLET | Freq: Three times a day (TID) | ORAL | 1 refills | Status: DC | PRN

## 2020-03-28 MED ORDER — SERTRALINE HCL 100 MG PO TABS: 100.0000 mg | ORAL_TABLET | Freq: Every day | ORAL | 3 refills | Status: DC

## 2020-03-28 NOTE — Progress Notes (Signed)
Pt is here for ROB and 2 hour GTT, [redacted]w[redacted]d.

## 2020-03-28 NOTE — Patient Instructions (Signed)
Third Trimester of Pregnancy The third trimester is from week 28 through week 40 (months 7 through 9). The third trimester is a time when the unborn baby (fetus) is growing rapidly. At the end of the ninth month, the fetus is about 20 inches in length and weighs 6-10 pounds. Body changes during your third trimester Your body will continue to go through many changes during pregnancy. The changes vary from woman to woman. During the third trimester:  Your weight will continue to increase. You can expect to gain 25-35 pounds (11-16 kg) by the end of the pregnancy.  You may begin to get stretch marks on your hips, abdomen, and breasts.  You may urinate more often because the fetus is moving lower into your pelvis and pressing on your bladder.  You may develop or continue to have heartburn. This is caused by increased hormones that slow down muscles in the digestive tract.  You may develop or continue to have constipation because increased hormones slow digestion and cause the muscles that push waste through your intestines to relax.  You may develop hemorrhoids. These are swollen veins (varicose veins) in the rectum that can itch or be painful.  You may develop swollen, bulging veins (varicose veins) in your legs.  You may have increased body aches in the pelvis, back, or thighs. This is due to weight gain and increased hormones that are relaxing your joints.  You may have changes in your hair. These can include thickening of your hair, rapid growth, and changes in texture. Some women also have hair loss during or after pregnancy, or hair that feels dry or thin. Your hair will most likely return to normal after your baby is born.  Your breasts will continue to grow and they will continue to become tender. A yellow fluid (colostrum) may leak from your breasts. This is the first milk you are producing for your baby.  Your belly button may stick out.  You may notice more swelling in your hands,  face, or ankles.  You may have increased tingling or numbness in your hands, arms, and legs. The skin on your belly may also feel numb.  You may feel short of breath because of your expanding uterus.  You may have more problems sleeping. This can be caused by the size of your belly, increased need to urinate, and an increase in your body's metabolism.  You may notice the fetus "dropping," or moving lower in your abdomen (lightening).  You may have increased vaginal discharge.  You may notice your joints feel loose and you may have pain around your pelvic bone. What to expect at prenatal visits You will have prenatal exams every 2 weeks until week 36. Then you will have weekly prenatal exams. During a routine prenatal visit:  You will be weighed to make sure you and the baby are growing normally.  Your blood pressure will be taken.  Your abdomen will be measured to track your baby's growth.  The fetal heartbeat will be listened to.  Any test results from the previous visit will be discussed.  You may have a cervical check near your due date to see if your cervix has softened or thinned (effaced).  You will be tested for Group B streptococcus. This happens between 35 and 37 weeks. Your health care provider may ask you:  What your birth plan is.  How you are feeling.  If you are feeling the baby move.  If you have had any abnormal   symptoms, such as leaking fluid, bleeding, severe headaches, or abdominal cramping.  If you are using any tobacco products, including cigarettes, chewing tobacco, and electronic cigarettes.  If you have any questions. Other tests or screenings that may be performed during your third trimester include:  Blood tests that check for low iron levels (anemia).  Fetal testing to check the health, activity level, and growth of the fetus. Testing is done if you have certain medical conditions or if there are problems during the pregnancy.  Nonstress test  (NST). This test checks the health of your baby to make sure there are no signs of problems, such as the baby not getting enough oxygen. During this test, a belt is placed around your belly. The baby is made to move, and its heart rate is monitored during movement. What is false labor? False labor is a condition in which you feel small, irregular tightenings of the muscles in the womb (contractions) that usually go away with rest, changing position, or drinking water. These are called Braxton Hicks contractions. Contractions may last for hours, days, or even weeks before true labor sets in. If contractions come at regular intervals, become more frequent, increase in intensity, or become painful, you should see your health care provider. What are the signs of labor?  Abdominal cramps.  Regular contractions that start at 10 minutes apart and become stronger and more frequent with time.  Contractions that start on the top of the uterus and spread down to the lower abdomen and back.  Increased pelvic pressure and dull back pain.  A watery or bloody mucus discharge that comes from the vagina.  Leaking of amniotic fluid. This is also known as your "water breaking." It could be a slow trickle or a gush. Let your health care provider know if it has a color or strange odor. If you have any of these signs, call your health care provider right away, even if it is before your due date. Follow these instructions at home: Medicines  Follow your health care provider's instructions regarding medicine use. Specific medicines may be either safe or unsafe to take during pregnancy.  Take a prenatal vitamin that contains at least 600 micrograms (mcg) of folic acid.  If you develop constipation, try taking a stool softener if your health care provider approves. Eating and drinking   Eat a balanced diet that includes fresh fruits and vegetables, whole grains, good sources of protein such as meat, eggs, or tofu,  and low-fat dairy. Your health care provider will help you determine the amount of weight gain that is right for you.  Avoid raw meat and uncooked cheese. These carry germs that can cause birth defects in the baby.  If you have low calcium intake from food, talk to your health care provider about whether you should take a daily calcium supplement.  Eat four or five small meals rather than three large meals a day.  Limit foods that are high in fat and processed sugars, such as fried and sweet foods.  To prevent constipation: ? Drink enough fluid to keep your urine clear or pale yellow. ? Eat foods that are high in fiber, such as fresh fruits and vegetables, whole grains, and beans. Activity  Exercise only as directed by your health care provider. Most women can continue their usual exercise routine during pregnancy. Try to exercise for 30 minutes at least 5 days a week. Stop exercising if you experience uterine contractions.  Avoid heavy lifting.  Do   not exercise in extreme heat or humidity, or at high altitudes.  Wear low-heel, comfortable shoes.  Practice good posture.  You may continue to have sex unless your health care provider tells you otherwise. Relieving pain and discomfort  Take frequent breaks and rest with your legs elevated if you have leg cramps or low back pain.  Take warm sitz baths to soothe any pain or discomfort caused by hemorrhoids. Use hemorrhoid cream if your health care provider approves.  Wear a good support bra to prevent discomfort from breast tenderness.  If you develop varicose veins: ? Wear support pantyhose or compression stockings as told by your healthcare provider. ? Elevate your feet for 15 minutes, 3-4 times a day. Prenatal care  Write down your questions. Take them to your prenatal visits.  Keep all your prenatal visits as told by your health care provider. This is important. Safety  Wear your seat belt at all times when driving.  Make  a list of emergency phone numbers, including numbers for family, friends, the hospital, and police and fire departments. General instructions  Avoid cat litter boxes and soil used by cats. These carry germs that can cause birth defects in the baby. If you have a cat, ask someone to clean the litter box for you.  Do not travel far distances unless it is absolutely necessary and only with the approval of your health care provider.  Do not use hot tubs, steam rooms, or saunas.  Do not drink alcohol.  Do not use any products that contain nicotine or tobacco, such as cigarettes and e-cigarettes. If you need help quitting, ask your health care provider.  Do not use any medicinal herbs or unprescribed drugs. These chemicals affect the formation and growth of the baby.  Do not douche or use tampons or scented sanitary pads.  Do not cross your legs for long periods of time.  To prepare for the arrival of your baby: ? Take prenatal classes to understand, practice, and ask questions about labor and delivery. ? Make a trial run to the hospital. ? Visit the hospital and tour the maternity area. ? Arrange for maternity or paternity leave through employers. ? Arrange for family and friends to take care of pets while you are in the hospital. ? Purchase a rear-facing car seat and make sure you know how to install it in your car. ? Pack your hospital bag. ? Prepare the baby's nursery. Make sure to remove all pillows and stuffed animals from the baby's crib to prevent suffocation.  Visit your dentist if you have not gone during your pregnancy. Use a soft toothbrush to brush your teeth and be gentle when you floss. Contact a health care provider if:  You are unsure if you are in labor or if your water has broken.  You become dizzy.  You have mild pelvic cramps, pelvic pressure, or nagging pain in your abdominal area.  You have lower back pain.  You have persistent nausea, vomiting, or  diarrhea.  You have an unusual or bad smelling vaginal discharge.  You have pain when you urinate. Get help right away if:  Your water breaks before 37 weeks.  You have regular contractions less than 5 minutes apart before 37 weeks.  You have a fever.  You are leaking fluid from your vagina.  You have spotting or bleeding from your vagina.  You have severe abdominal pain or cramping.  You have rapid weight loss or weight gain.  You have   shortness of breath with chest pain.  You notice sudden or extreme swelling of your face, hands, ankles, feet, or legs.  Your baby makes fewer than 10 movements in 2 hours.  You have severe headaches that do not go away when you take medicine.  You have vision changes. Summary  The third trimester is from week 28 through week 40, months 7 through 9. The third trimester is a time when the unborn baby (fetus) is growing rapidly.  During the third trimester, your discomfort may increase as you and your baby continue to gain weight. You may have abdominal, leg, and back pain, sleeping problems, and an increased need to urinate.  During the third trimester your breasts will keep growing and they will continue to become tender. A yellow fluid (colostrum) may leak from your breasts. This is the first milk you are producing for your baby.  False labor is a condition in which you feel small, irregular tightenings of the muscles in the womb (contractions) that eventually go away. These are called Braxton Hicks contractions. Contractions may last for hours, days, or even weeks before true labor sets in.  Signs of labor can include: abdominal cramps; regular contractions that start at 10 minutes apart and become stronger and more frequent with time; watery or bloody mucus discharge that comes from the vagina; increased pelvic pressure and dull back pain; and leaking of amniotic fluid. This information is not intended to replace advice given to you by your  health care provider. Make sure you discuss any questions you have with your health care provider. Document Revised: 09/16/2018 Document Reviewed: 07/01/2016 Elsevier Patient Education  2020 [employer] Fetal Movement Counts Patient Name: ________________________________________________ Patient Due Date: ____________________ What is a fetal movement count?  A fetal movement count is the number of times that you feel your baby move during a certain amount of time. This may also be called a fetal kick count. A fetal movement count is recommended for every pregnant woman. You may be asked to start counting fetal movements as early as week 28 of your pregnancy. Pay attention to when your baby is most active. You may notice your baby's sleep and wake cycles. You may also notice things that make your baby move more. You should do a fetal movement count:  When your baby is normally most active.  At the same time each day. A good time to count movements is while you are resting, after having something to eat and drink. How do I count fetal movements? 1. Find a quiet, comfortable area. Sit, or lie down on your side. 2. Write down the date, the start time and stop time, and the number of movements that you felt between those two times. Take this information with you to your health care visits. 3. Write down your start time when you feel the first movement. 4. Count kicks, flutters, swishes, rolls, and jabs. You should feel at least 10 movements. 5. You may stop counting after you have felt 10 movements, or if you have been counting for 2 hours. Write down the stop time. 6. If you do not feel 10 movements in 2 hours, contact your health care provider for further instructions. Your health care provider may want to do additional tests to assess your baby's well-being. Contact a health care provider if:  You feel fewer than 10 movements in 2 hours.  Your baby is not moving like he or she usually  does. Date: ____________ Start time: ____________   Stop time: ____________ Movements: ____________ Date: ____________ Start time: ____________ Stop time: ____________ Movements: ____________ Date: ____________ Start time: ____________ Stop time: ____________ Movements: ____________ Date: ____________ Start time: ____________ Stop time: ____________ Movements: ____________ Date: ____________ Start time: ____________ Stop time: ____________ Movements: ____________ Date: ____________ Start time: ____________ Stop time: ____________ Movements: ____________ Date: ____________ Start time: ____________ Stop time: ____________ Movements: ____________ Date: ____________ Start time: ____________ Stop time: ____________ Movements: ____________ Date: ____________ Start time: ____________ Stop time: ____________ Movements: ____________ This information is not intended to replace advice given to you by your health care provider. Make sure you discuss any questions you have with your health care provider. Document Revised: 01/13/2019 Document Reviewed: 01/13/2019 Elsevier Patient Education  2020 [employer] Iron-Rich Diet  Iron is a mineral that helps your body to produce hemoglobin. Hemoglobin is a protein in red blood cells that carries oxygen to your body's tissues. Eating too little iron may cause you to feel weak and tired, and it can increase your risk of infection. Iron is naturally found in many foods, and many foods have iron added to them (iron-fortified foods). You may need to follow an iron-rich diet if you do not have enough iron in your body due to certain medical conditions. The amount of iron that you need each day depends on your age, your sex, and any medical conditions you have. Follow instructions from your health care provider or a diet and nutrition specialist (dietitian) about how much iron you should eat each day. What are tips for following this plan? Reading food labels  Check food  labels to see how many milligrams (mg) of iron are in each serving. Cooking  Cook foods in pots and pans that are made from iron.  Take these steps to make it easier for your body to absorb iron from certain foods: ? Soak beans overnight before cooking. ? Soak whole grains overnight and drain them before using. ? Ferment flours before baking, such as by using yeast in bread dough. Meal planning  When you eat foods that contain iron, you should eat them with foods that are high in vitamin C. These include oranges, peppers, tomatoes, potatoes, and mango. Vitamin C helps your body to absorb iron. General information  Take iron supplements only as told by your health care provider. An overdose of iron can be life-threatening. If you were prescribed iron supplements, take them with orange juice or a vitamin C supplement.  When you eat iron-fortified foods or take an iron supplement, you should also eat foods that naturally contain iron, such as meat, poultry, and fish. Eating naturally iron-rich foods helps your body to absorb the iron that is added to other foods or contained in a supplement.  Certain foods and drinks prevent your body from absorbing iron properly. Avoid eating these foods in the same meal as iron-rich foods or with iron supplements. These foods include: ? Coffee, black tea, and red wine. ? Milk, dairy products, and foods that are high in calcium. ? Beans and soybeans. ? Whole grains. What foods should I eat? Fruits Prunes. Raisins. Eat fruits high in vitamin C, such as oranges, grapefruits, and strawberries, alongside iron-rich foods. Vegetables Spinach (cooked). Green peas. Broccoli. Fermented vegetables. Eat vegetables high in vitamin C, such as leafy greens, potatoes, bell peppers, and tomatoes, alongside iron-rich foods. Grains Iron-fortified breakfast cereal. Iron-fortified whole-wheat bread. Enriched rice. Sprouted grains. Meats and other proteins Beef liver.  Oysters. Beef. Shrimp. Turkey. Chicken. Tuna. Sardines. Chickpeas. Nuts.   Tofu. Pumpkin seeds. Beverages Tomato juice. Fresh orange juice. Prune juice. Hibiscus tea. Fortified instant breakfast shakes. Sweets and desserts Blackstrap molasses. Seasonings and condiments Tahini. Fermented soy sauce. Other foods Wheat germ. The items listed above may not be a complete list of recommended foods and beverages. Contact a dietitian for more information. What foods should I avoid? Grains Whole grains. Bran cereal. Bran flour. Oats. Meats and other proteins Soybeans. Products made from soy protein. Black beans. Lentils. Mung beans. Split peas. Dairy Milk. Cream. Cheese. Yogurt. Cottage cheese. Beverages Coffee. Black tea. Red wine. Sweets and desserts Cocoa. Chocolate. Ice cream. Other foods Basil. Oregano. Large amounts of parsley. The items listed above may not be a complete list of foods and beverages to avoid. Contact a dietitian for more information. Summary  Iron is a mineral that helps your body to produce hemoglobin. Hemoglobin is a protein in red blood cells that carries oxygen to your body's tissues.  Iron is naturally found in many foods, and many foods have iron added to them (iron-fortified foods).  When you eat foods that contain iron, you should eat them with foods that are high in vitamin C. Vitamin C helps your body to absorb iron.  Certain foods and drinks prevent your body from absorbing iron properly, such as whole grains and dairy products. You should avoid eating these foods in the same meal as iron-rich foods or with iron supplements. This information is not intended to replace advice given to you by your health care provider. Make sure you discuss any questions you have with your health care provider. Document Revised: 05/08/2017 Document Reviewed: 04/21/2017 Elsevier Patient Education  2020 [employer] Inc[employer]  

## 2020-03-28 NOTE — Progress Notes (Signed)
  HIGH-RISK PREGNANCY OFFICE VISIT Patient name: Abigail Hardin MRN [id number]  Date of birth: Jun 13, 1990 Chief Complaint:   Routine Prenatal Visit  History of Present Illness:   Abigail Hardin is a 29 y.o. [id number] female at [redacted]w[redacted]d with an Estimated Date of Delivery: 06/02/20 being seen today for ongoing management of a high-risk pregnancy complicated by [facility] currently on no meds. H/O anxiety on Zoloft Today she reports no complaints. Contractions: Irritability. Vag. Bleeding: None.  Movement: Present. denies leaking of fluid.  Review of Systems:   Pertinent items are noted in HPI Denies abnormal vaginal discharge w/ itching/odor/irritation, headaches, visual changes, shortness of breath, chest pain, abdominal pain, severe nausea/vomiting, or problems with urination or bowel movements unless otherwise stated above. Pertinent History Reviewed:  Reviewed past medical,surgical, social, obstetrical and family history.  Reviewed problem list, medications and allergies. Physical Assessment:   Vitals:   03/28/20 0944  BP: 118/75  Pulse: 84  Weight: 235 lb 3.2 oz (106.7 kg)  Body mass index is 39.14 kg/m.           Physical Examination:   General appearance: alert, well appearing, and in no distress, oriented to person, place, and time and overweight  Mental status: alert, oriented to person, place, and time, normal mood, behavior, speech, dress, motor activity, and thought processes  Skin: warm & dry   Extremities:      Cardiovascular: normal heart rate noted  Respiratory: normal respiratory effort, no distress  Abdomen: gravid, soft, non-tender  Pelvic: Cervical exam deferred         Fetal Status: Fetal Heart Rate (bpm): 143 Fundal Height: 30 cm Movement: Present    Fetal Surveillance Testing today: none   No results found for this or any previous visit (from the past 24 hour(s)).  Assessment & Plan:  1) High-risk pregnancy [id number] at [redacted]w[redacted]d with an Estimated Date of Delivery: 06/02/20    2) Supervision of high risk pregnancy in second trimester  - Glucose Tolerance, 2 Hours w/1 Hour,  - CBC,  - RPR,  - HIV Antibody (routine testing w rflx) - Declined tdaP and flu vaccines  3) History of anxiety - Zoloft Rx renewed  4) Nausea and vomiting during pregnancy - Rx for Zofran renewed  5) [redacted] weeks gestation of pregnancy  Meds: No orders of the defined types were placed in this encounter.   Labs/procedures today: 2 hr GTT and 3rd trimester labs  Treatment Plan:    Reviewed: Preterm labor symptoms and general obstetric precautions including but not limited to vaginal bleeding, contractions, leaking of fluid and fetal movement were reviewed in detail with the patient.  All questions were answered. Has home bp cuff. Check bp weekly, let us know if >140/90.   Follow-up: Return in about 2 weeks (around 04/11/2020) for Return OB - My Chart video.  Orders Placed This Encounter  Procedures  . Glucose Tolerance, 2 Hours w/1 Hour  . CBC  . RPR  . HIV Antibody (routine testing w rflx)   Raelyn Mora MSN, CNM 03/28/2020

## 2020-03-28 NOTE — Progress Notes (Deleted)
   LOW-RISK PREGNANCY OFFICE VISIT Patient name: Abigail Hardin MRN [id number]  Date of birth: 02/14/1991 Chief Complaint:   Routine Prenatal Visit  History of Present Illness:   Abigail Hardin is a 29 y.o. [id number] female at [redacted]w[redacted]d with an Estimated Date of Delivery: 06/02/20 being seen today for ongoing management of a low-risk pregnancy.  Today she reports {sx:14538}. Contractions: Irritability. Vag. Bleeding: None.  Movement: Present. {Actions; denies-reports:120008} leaking of fluid. Review of Systems:   Pertinent items are noted in HPI Denies abnormal vaginal discharge w/ itching/odor/irritation, headaches, visual changes, shortness of breath, chest pain, abdominal pain, severe nausea/vomiting, or problems with urination or bowel movements unless otherwise stated above. Pertinent History Reviewed:  Reviewed past medical,surgical, social, obstetrical and family history.  Reviewed problem list, medications and allergies. Physical Assessment:   Vitals:   03/28/20 0944  BP: 118/75  Pulse: 84  Weight: 235 lb 3.2 oz (106.7 kg)  Body mass index is 39.14 kg/m.        Physical Examination:   General appearance: Well appearing, and in no distress  Mental status: Alert, oriented to person, place, and time  Skin: Warm & dry  Cardiovascular: Normal heart rate noted  Respiratory: Normal respiratory effort, no distress  Abdomen: Soft, gravid, nontender  Pelvic: {Blank single:19197::"Cervical exam performed","Cervical exam deferred"}         Extremities:    Fetal Status: Fetal Heart Rate (bpm): 143   Movement: Present    No results found for this or any previous visit (from the past 24 hour(s)).  Assessment & Plan:  1) Low-risk pregnancy [id number] at [redacted]w[redacted]d with an Estimated Date of Delivery: 06/02/20   2) ***, ***   Meds: No orders of the defined types were placed in this encounter.  Labs/procedures today: ***  Plan:  Continue routine obstetrical care ***  Reviewed: {Blank  single:19197::"Term","Preterm"} labor symptoms and general obstetric precautions including but not limited to vaginal bleeding, contractions, leaking of fluid and fetal movement were reviewed in detail with the patient.  All questions were answered. *** home bp cuff. Rx faxed to ***. Check bp weekly, let us know if >140/90.   Follow-up: Return in about 2 weeks (around 04/11/2020) for Return OB - My Chart video.  Orders Placed This Encounter  Procedures  . Glucose Tolerance, 2 Hours w/1 Hour  . CBC  . RPR  . HIV Antibody (routine testing w rflx)   Raelyn Mora MSN, CNM 03/28/2020 9:50 AM

## 2020-03-29 LAB — CBC
Hematocrit: 35.3 % (ref 34.0–46.6)
Hemoglobin: 12.1 g/dL (ref 11.1–15.9)
MCH: 31.6 pg (ref 26.6–33.0)
MCHC: 34.3 g/dL (ref 31.5–35.7)
MCV: 92 fL (ref 79–97)
Platelets: 180 10*3/uL (ref 150–450)
RBC: 3.83 x10E6/uL (ref 3.77–5.28)
RDW: 13.2 % (ref 11.7–15.4)
WBC: 7.7 10*3/uL (ref 3.4–10.8)

## 2020-03-29 LAB — GLUCOSE TOLERANCE, 2 HOURS W/ 1HR
Glucose, 1 hour: 133 mg/dL (ref 65–179)
Glucose, 2 hour: 96 mg/dL (ref 65–152)
Glucose, Fasting: 90 mg/dL (ref 65–91)

## 2020-03-29 LAB — RPR: RPR Ser Ql: NONREACTIVE

## 2020-03-29 LAB — HIV ANTIBODY (ROUTINE TESTING W REFLEX): HIV Screen 4th Generation wRfx: NONREACTIVE

## 2020-04-04 ENCOUNTER — Encounter (HOSPITAL_COMMUNITY): Payer: Self-pay | Admitting: Obstetrics & Gynecology

## 2020-04-04 ENCOUNTER — Other Ambulatory Visit: Payer: Self-pay

## 2020-04-04 ENCOUNTER — Inpatient Hospital Stay (HOSPITAL_COMMUNITY)
Admission: AD | Admit: 2020-04-04 | Discharge: 2020-04-04 | Disposition: A | Discharge status: Home / Self Care | Payer: Medicaid Other | Attending: Obstetrics & Gynecology | Admitting: Obstetrics & Gynecology

## 2020-04-04 DIAGNOSIS — O368130 Decreased fetal movements, third trimester, not applicable or unspecified: Secondary | ICD-10-CM | POA: Insufficient documentation

## 2020-04-04 DIAGNOSIS — Z79899 Other long term (current) drug therapy: Secondary | ICD-10-CM | POA: Diagnosis not present

## 2020-04-04 DIAGNOSIS — L0211 Cutaneous abscess of neck: Secondary | ICD-10-CM | POA: Diagnosis not present

## 2020-04-04 DIAGNOSIS — O99013 Anemia complicating pregnancy, third trimester: Secondary | ICD-10-CM | POA: Insufficient documentation

## 2020-04-04 DIAGNOSIS — L0291 Cutaneous abscess, unspecified: Secondary | ICD-10-CM

## 2020-04-04 DIAGNOSIS — Z87891 Personal history of nicotine dependence: Secondary | ICD-10-CM | POA: Diagnosis not present

## 2020-04-04 DIAGNOSIS — D573 Sickle-cell trait: Secondary | ICD-10-CM | POA: Insufficient documentation

## 2020-04-04 DIAGNOSIS — O99713 Diseases of the skin and subcutaneous tissue complicating pregnancy, third trimester: Secondary | ICD-10-CM | POA: Diagnosis not present

## 2020-04-04 DIAGNOSIS — Z3A31 31 weeks gestation of pregnancy: Secondary | ICD-10-CM | POA: Diagnosis not present

## 2020-04-04 DIAGNOSIS — O99343 Other mental disorders complicating pregnancy, third trimester: Secondary | ICD-10-CM | POA: Diagnosis not present

## 2020-04-04 DIAGNOSIS — F329 Major depressive disorder, single episode, unspecified: Secondary | ICD-10-CM | POA: Insufficient documentation

## 2020-04-04 DIAGNOSIS — F419 Anxiety disorder, unspecified: Secondary | ICD-10-CM | POA: Insufficient documentation

## 2020-04-04 LAB — CBC WITH DIFFERENTIAL/PLATELET
Abs Immature Granulocytes: 0.1 10*3/uL — ABNORMAL HIGH (ref 0.00–0.07)
Basophils Absolute: 0 10*3/uL (ref 0.0–0.1)
Basophils Relative: 0 %
Eosinophils Absolute: 0.1 10*3/uL (ref 0.0–0.5)
Eosinophils Relative: 1 %
HCT: 35 % — ABNORMAL LOW (ref 36.0–46.0)
Hemoglobin: 12.2 g/dL (ref 12.0–15.0)
Immature Granulocytes: 1 %
Lymphocytes Relative: 21 %
Lymphs Abs: 2.2 10*3/uL (ref 0.7–4.0)
MCH: 31.4 pg (ref 26.0–34.0)
MCHC: 34.9 g/dL (ref 30.0–36.0)
MCV: 90.2 fL (ref 80.0–100.0)
Monocytes Absolute: 0.8 10*3/uL (ref 0.1–1.0)
Monocytes Relative: 8 %
Neutro Abs: 7.2 10*3/uL (ref 1.7–7.7)
Neutrophils Relative %: 69 %
Platelets: 176 10*3/uL (ref 150–400)
RBC: 3.88 MIL/uL (ref 3.87–5.11)
RDW: 13 % (ref 11.5–15.5)
WBC: 10.4 10*3/uL (ref 4.0–10.5)
nRBC: 0 % (ref 0.0–0.2)

## 2020-04-04 LAB — COMPREHENSIVE METABOLIC PANEL
ALT: 16 U/L (ref 0–44)
AST: 19 U/L (ref 15–41)
Albumin: 2.8 g/dL — ABNORMAL LOW (ref 3.5–5.0)
Alkaline Phosphatase: 206 U/L — ABNORMAL HIGH (ref 38–126)
Anion gap: 12 (ref 5–15)
BUN: 6 mg/dL (ref 6–20)
CO2: 20 mmol/L — ABNORMAL LOW (ref 22–32)
Calcium: 8.7 mg/dL — ABNORMAL LOW (ref 8.9–10.3)
Chloride: 102 mmol/L (ref 98–111)
Creatinine, Ser: 0.52 mg/dL (ref 0.44–1.00)
GFR, Estimated: >60 mL/min (ref >60)
Glucose, Bld: 95 mg/dL (ref 70–99)
Potassium: 3.5 mmol/L (ref 3.5–5.1)
Sodium: 134 mmol/L — ABNORMAL LOW (ref 135–145)
Total Bilirubin: 0.5 mg/dL (ref 0.3–1.2)
Total Protein: 6.4 g/dL — ABNORMAL LOW (ref 6.5–8.1)

## 2020-04-04 MED ORDER — LIDOCAINE-EPINEPHRINE 1 %-1:100000 IJ SOLN
30.0000 mL | Freq: Once | INTRAMUSCULAR | Status: DC
  Filled 2020-04-04 (×2): qty 30

## 2020-04-04 MED ORDER — AMOXICILLIN-POT CLAVULANATE 875-125 MG PO TABS
1.0000 | ORAL_TABLET | Freq: Two times a day (BID) | ORAL | 0 refills | Status: AC
Start: 2020-04-04 — End: 2020-04-11

## 2020-04-04 MED ORDER — LIDOCAINE HCL (PF) 1 % IJ SOLN
30.0000 mL | Freq: Once | INTRAMUSCULAR | Status: AC
  Administered 2020-04-04: 30 mL via INTRADERMAL
  Filled 2020-04-04: qty 30

## 2020-04-04 NOTE — MAU Provider Note (Signed)
History     [id number]  Arrival date and time: 04/04/20 1814    Chief Complaint  Patient presents with   Decreased Fetal Movement     HPI Abigail Hardin is a 29 y.o. at [redacted]w[redacted]d by LMP with PMHx notable for cHTN, GDM (prior preg), depression, sickle cell trait, who presents for lesion on neck and DFM.  Neck lesion -scratched by a stray cat approx 3 days ago -has noticed painful lesion on posterior neck -getting larger -fever to 101F today -no other sick symptoms -no history of similar symptoms -small lesion on posterior trapezius as well, no lesions elsewhere -has not used any medication for this issue -non pruritic  DFM -noticed today -felt baby move earlier this morning -felt baby hiccup this afternoon -has done kick counts without success -has drank cold and sugary beverages without success -no issues with DFM previously   OB History    Gravida  6   Para  2   Term  1   Preterm  1   AB  3   Living  2     SAB  1   TAB  2   Ectopic  0   Multiple  0   Live Births  2           Past Medical History:  Diagnosis Date   Anxiety    Hypertension    does not currently take meds    Past Surgical History:  Procedure Laterality Date   DILATION AND CURETTAGE OF UTERUS     INDUCED ABORTION  04/09/2011   TONSILLECTOMY      Family History  Problem Relation Age of Onset   Asthma Daughter    Asthma Son    Hypertension Maternal Aunt     Social History   Socioeconomic History   Marital status: Single    Spouse name: Not on file   Number of children: Not on file   Years of education: Not on file   Highest education level: Not on file  Occupational History   Not on file  Tobacco Use   Smoking status: Former Smoker    Packs/day: 0.25    Years: 18.00    Pack years: 4.50    Types: Cigarettes    Quit date: 02/2019    Years since quitting: 1.1   Smokeless tobacco: Never Used  Vaping Use   Vaping Use: Never used  Substance  and Sexual Activity   Alcohol use: Not Currently   Drug use: No   Sexual activity: Yes  Other Topics Concern   Not on file  Social History Narrative   Not on file   Social Determinants of Health   Financial Resource Strain:    Difficulty of Paying Living Expenses: Not on file  Food Insecurity:    Worried About Programme researcher, broadcasting/film/video in the Last Year: Not on file   [employer] of Food in the Last Year: Not on file  Transportation Needs:    Lack of Transportation (Medical): Not on file   Lack of Transportation (Non-Medical): Not on file  Physical Activity:    Days of Exercise per Week: Not on file   Minutes of Exercise per Session: Not on file  Stress:    Feeling of Stress : Not on file  Social Connections:    Frequency of Communication with Friends and Family: Not on file   Frequency of Social Gatherings with Friends and Family: Not on file   Attends Religious Services:  Not on file   Active Member of Clubs or Organizations: Not on file   Attends Club or Organization Meetings: Not on file   Marital Status: Not on file  Intimate Partner Violence:    Fear of Current or Ex-Partner: Not on file   Emotionally Abused: Not on file   Physically Abused: Not on file   Sexually Abused: Not on file    Allergies  Allergen Reactions   Latex Rash    No current facility-administered medications on file prior to encounter.   Current Outpatient Medications on File Prior to Encounter  Medication Sig Dispense Refill   aspirin EC 81 MG tablet Take 1 tablet (81 mg total) by mouth daily. Take after 12 weeks for prevention of preeclampsia later in pregnancy 300 tablet 2   Blood Pressure Monitor DEVI Please check blood pressure 1-2 times per week. 1 each 0   Elastic Bandages & Supports (COMFORT FIT MATERNITY SUPP SM) MISC Wear as directed. 1 each 0   ondansetron (ZOFRAN ODT) 4 MG disintegrating tablet Take 1 tablet (4 mg total) by mouth every 8 (eight) hours as needed for  nausea or vomiting. 30 tablet 1   prenatal vitamin w/FE, FA (PRENATAL 1 + 1) 27-1 MG TABS tablet Take 1 tablet by mouth daily at 12 noon. 30 tablet 0   sertraline (ZOLOFT) 100 MG tablet Take 1 tablet (100 mg total) by mouth daily. 30 tablet 3   Prenatal Vit-Fe Fumarate-FA (MULTIVITAMIN-PRENATAL) 27-0.8 MG TABS tablet Take 1 tablet by mouth daily at 12 noon. (Patient not taking: Reported on 03/28/2020)       Review of Systems  Constitutional: Positive for chills and fever.  Respiratory: Negative for cough and shortness of breath.   Cardiovascular: Negative for chest pain, palpitations and leg swelling.  Gastrointestinal: Positive for nausea. Negative for abdominal pain, diarrhea and vomiting.  Genitourinary: Negative for dysuria, frequency and urgency.  Musculoskeletal: Positive for neck pain.  Skin: Negative for itching.       Painful lesion on posterior neck   Neurological: Negative for sensory change and headaches.  Psychiatric/Behavioral: Negative.    Pertinent positives and negative per HPI, all others reviewed and negative  Physical Exam   LMP 08/27/2019   Physical Exam Vitals and nursing note reviewed. Exam conducted with a chaperone present.  Constitutional:      General: She is not in acute distress.    Appearance: Normal appearance. She is normal weight.  HENT:     Head: Normocephalic and atraumatic.     Nose: Nose normal.     Mouth/Throat:     Mouth: Mucous membranes are moist.     Pharynx: Oropharynx is clear.  Eyes:     Extraocular Movements: Extraocular movements intact.     Conjunctiva/sclera: Conjunctivae normal.  Neck:     Comments: 3cm x 2cm indurated, fluctuant lesion with central pustule on posterior neck  Cardiovascular:     Rate and Rhythm: Normal rate.     Pulses: Normal pulses.  Pulmonary:     Effort: Pulmonary effort is normal.  Abdominal:     Tenderness: There is no abdominal tenderness. There is no guarding.     Comments: gravid   Musculoskeletal:        General: Normal range of motion.     Cervical back: Normal range of motion and neck supple.  Lymphadenopathy:     Cervical: Cervical adenopathy (left posterior) present.  Skin:    General: Skin is warm and dry.  Neurological:     General: No focal deficit present.     Mental Status: She is alert and oriented to person, place, and time. Mental status is at baseline.  Psychiatric:        Mood and Affect: Mood normal.        Behavior: Behavior normal.     Cervical Exam  not indicated  Bedside Ultrasound Not indicated  My interpretation: n/a  FHT Baseline 140bpm, mod variability, +accels, no decels Toco: quiet Cat: 1  Labs Results for orders placed or performed during the hospital encounter of 04/04/20 (from the past 24 hour(s))  CBC with Differential/Platelet     Status: Abnormal   Collection Time: 04/04/20  7:43 PM  Result Value Ref Range   WBC 10.4 4.0 - 10.5 K/uL   RBC 3.88 3.87 - 5.11 MIL/uL   Hemoglobin 12.2 12.0 - 15.0 g/dL   HCT 77.4 (L) 36 - 46 %   MCV 90.2 80.0 - 100.0 fL   MCH 31.4 26.0 - 34.0 pg   MCHC 34.9 30.0 - 36.0 g/dL   RDW 12.8 78.6 - 76.7 %   Platelets 176 150 - 400 K/uL   nRBC 0.0 0.0 - 0.2 %   Neutrophils Relative % 69 %   Neutro Abs 7.2 1.7 - 7.7 K/uL   Lymphocytes Relative 21 %   Lymphs Abs 2.2 0.7 - 4.0 K/uL   Monocytes Relative 8 %   Monocytes Absolute 0.8 0.1 - 1.0 K/uL   Eosinophils Relative 1 %   Eosinophils Absolute 0.1 0.0 - 0.5 K/uL   Basophils Relative 0 %   Basophils Absolute 0.0 0.0 - 0.1 K/uL   Immature Granulocytes 1 %   Abs Immature Granulocytes 0.10 (H) 0.00 - 0.07 K/uL    Imaging No results found.  MAU Course  Procedures  Lab Orders     Toxoplasma antibodies- IgG and  IgM     CBC with Differential/Platelet     Comprehensive metabolic panel Meds ordered this encounter  Medications   lidocaine-EPINEPHrine (XYLOCAINE W/EPI) 1 %-1:100000 (with pres) injection 30 mL   lidocaine (PF)  (XYLOCAINE) 1 % injection 30 mL   Imaging Orders  No imaging studies ordered today    MDM moderate  Incision and Drainage Procedure Note  Written consent obtained. Timeout performed. Risks and benefits of procedure discussed with patient. Posterior neck cleaned with betadine x3 and injected with a total of 20cc lidocaine 1% w/o epinephrine. Cleaned with betadine x3 again. 11 blade scalpel used to create central incision on abscess with purulent drainage expressed. Curved hemostats then used to break up loculations in abscess. Abscess was then packed with iodoform dressing. Patient given aftercare instructions and tolerated procedure well with minimal blood loss.  Assessment and Plan  29yo [id number] at [redacted]w[redacted]d presents to MAU for th following:  #Neck abscess Patient has been caring for a stray cat which scratched the back of her neck and lead to abscess formation. She has also been febrile to 101F with this issue, no other source of infection. Today in MAU, VSS. Physical exam remarkable for 3 x 2cm lesion on posterior neck, indurated and fluctuant in nature. WBC unremarkable. I&D performed as noted in procedure note above with improvement in symptoms. Will prescribe augmentin x7 days. Toxo IgG and IgM pending. Low suspicion for toxoplasmosis at this time, although patient counseled on cat contact and litter box care during pregnancy. Low suspicion for Bartonella henselae as well given only one small posterior cervical lymph  node but patient given strict return precautions. Message sent to [facility] for follow up of abscess in 2 days with FM provider, patient instructed to return to MAU if unable to schedule appt.   #DFM Patient with DFM x1 day with reactive NST and reassuring clicker test in MAU. Patient feeling baby move upon discharge from MAU. Counseled on kick counts, drinking sugary beverage and sleep cycles. Patient voiced understanding, she has follow up scheduled with OB next week.  #FWB FHT Cat  1 NST: reactive  Alric Seton

## 2020-04-04 NOTE — Discharge Instructions (Signed)
-take augmentin twice daily x7 days, take with food as it can cause upset stomach -take tylenol 650mg  every 6 hours as needed for pain -keep area clean and dry, attempt to keep packing in until Friday -I have sent a message to [facility] to have a follow up appt Friday with one of our providers-->if you don't hear from them please come back to MAU for re-evaluation -NO MORE CATS DURING PREGNANCY!  Skin Abscess  A skin abscess is an infected area of your skin that contains pus and other material. An abscess can happen in any part of your body. Some abscesses break open (rupture) on their own. Most continue to get worse unless they are treated. The infection can spread deeper into the body and into your blood, which can make you feel sick. A skin abscess is caused by germs that enter the skin through a cut or scrape. It can also be caused by blocked oil and sweat glands or infected hair follicles. This condition is usually treated by:  Draining the pus.  Taking antibiotic medicines.  Placing a warm, wet washcloth over the abscess. Follow these instructions at home: Medicines   Take over-the-counter and prescription medicines only as told by your doctor.  If you were prescribed an antibiotic medicine, take it as told by your doctor. Do not stop taking the antibiotic even if you start to feel better. Abscess care   If you have an abscess that has not drained, place a warm, clean, wet washcloth over the abscess several times a day. Do this as told by your doctor.  Follow instructions from your doctor about how to take care of your abscess. Make sure you: ? Cover the abscess with a bandage (dressing). ? Change your bandage or gauze as told by your doctor. ? Wash your hands with soap and water before you change the bandage or gauze. If you cannot use soap and water, use hand sanitizer.  Check your abscess every day for signs that the infection is getting worse. Check for: ? More  redness, swelling, or pain. ? More fluid or blood. ? Warmth. ? More pus or a bad smell. General instructions  To avoid spreading the infection: ? Do not share personal care items, towels, or hot tubs with others. ? Avoid making skin-to-skin contact with other people.  Keep all follow-up visits as told by your doctor. This is important. Contact a doctor if:  You have more redness, swelling, or pain around your abscess.  You have more fluid or blood coming from your abscess.  Your abscess feels warm when you touch it.  You have more pus or a bad smell coming from your abscess.  You have a fever.  Your muscles ache.  You have chills.  You feel sick. Get help right away if:  You have very bad (severe) pain.  You see red streaks on your skin spreading away from the abscess. Summary  A skin abscess is an infected area of your skin that contains pus and other material.  The abscess is caused by germs that enter the skin through a cut or scrape. It can also be caused by blocked oil and sweat glands or infected hair follicles.  Follow your doctor's instructions on caring for your abscess, taking medicines, preventing infections, and keeping follow-up visits. This information is not intended to replace advice given to you by your health care provider. Make sure you discuss any questions you have with your health care  provider. Document Revised: 12/30/2018 Document Reviewed: 07/09/2017 Elsevier Patient Education  2020 [employer].

## 2020-04-04 NOTE — MAU Note (Signed)
Pt reports fever yesterday, but in triage today 98.5.   Pt reports loss of appetite for 3 days since the cat scratched her.   Pt reports not feeling herself since her cat scratched her 3 days ago.   Pt reports only having the cat for 3 weeks and reports she has not taken the cat to vet. Pt reports scratch is warm to tough and is painful.   Pt reports decreased fetal movement since yesterday

## 2020-04-06 LAB — TOXOPLASMA ANTIBODIES- IGG AND  IGM
Toxoplasma Antibody- IgM: <3 AU/mL (ref 0.0–7.9)
Toxoplasma IgG Ratio: <3 IU/mL (ref 0.0–7.1)

## 2020-04-10 ENCOUNTER — Other Ambulatory Visit: Payer: Self-pay | Admitting: *Deleted

## 2020-04-10 ENCOUNTER — Other Ambulatory Visit: Payer: Self-pay

## 2020-04-10 ENCOUNTER — Ambulatory Visit: Payer: Medicaid Other | Admitting: *Deleted

## 2020-04-10 ENCOUNTER — Encounter: Payer: Self-pay | Admitting: *Deleted

## 2020-04-10 ENCOUNTER — Ambulatory Visit: Payer: Medicaid Other | Attending: Obstetrics and Gynecology

## 2020-04-10 DIAGNOSIS — O99343 Other mental disorders complicating pregnancy, third trimester: Secondary | ICD-10-CM | POA: Diagnosis not present

## 2020-04-10 DIAGNOSIS — O10913 Unspecified pre-existing hypertension complicating pregnancy, third trimester: Secondary | ICD-10-CM

## 2020-04-10 DIAGNOSIS — O09293 Supervision of pregnancy with other poor reproductive or obstetric history, third trimester: Secondary | ICD-10-CM

## 2020-04-10 DIAGNOSIS — D573 Sickle-cell trait: Secondary | ICD-10-CM | POA: Diagnosis present

## 2020-04-10 DIAGNOSIS — E669 Obesity, unspecified: Secondary | ICD-10-CM

## 2020-04-10 DIAGNOSIS — O10919 Unspecified pre-existing hypertension complicating pregnancy, unspecified trimester: Secondary | ICD-10-CM

## 2020-04-10 DIAGNOSIS — Z862 Personal history of diseases of the blood and blood-forming organs and certain disorders involving the immune mechanism: Secondary | ICD-10-CM

## 2020-04-10 DIAGNOSIS — O99213 Obesity complicating pregnancy, third trimester: Secondary | ICD-10-CM

## 2020-04-10 DIAGNOSIS — Z148 Genetic carrier of other disease: Secondary | ICD-10-CM

## 2020-04-10 DIAGNOSIS — Z3480 Encounter for supervision of other normal pregnancy, unspecified trimester: Secondary | ICD-10-CM | POA: Diagnosis present

## 2020-04-10 DIAGNOSIS — F99 Mental disorder, not otherwise specified: Secondary | ICD-10-CM

## 2020-04-10 DIAGNOSIS — Z362 Encounter for other antenatal screening follow-up: Secondary | ICD-10-CM

## 2020-04-10 DIAGNOSIS — Z3A32 32 weeks gestation of pregnancy: Secondary | ICD-10-CM

## 2020-04-11 ENCOUNTER — Telehealth (INDEPENDENT_AMBULATORY_CARE_PROVIDER_SITE_OTHER): Payer: Medicaid Other | Admitting: Obstetrics

## 2020-04-11 DIAGNOSIS — O09299 Supervision of pregnancy with other poor reproductive or obstetric history, unspecified trimester: Secondary | ICD-10-CM

## 2020-04-11 DIAGNOSIS — O10919 Unspecified pre-existing hypertension complicating pregnancy, unspecified trimester: Secondary | ICD-10-CM

## 2020-04-11 DIAGNOSIS — Z8632 Personal history of gestational diabetes: Secondary | ICD-10-CM

## 2020-04-11 DIAGNOSIS — Z8659 Personal history of other mental and behavioral disorders: Secondary | ICD-10-CM

## 2020-04-11 DIAGNOSIS — O0990 Supervision of high risk pregnancy, unspecified, unspecified trimester: Secondary | ICD-10-CM

## 2020-04-11 DIAGNOSIS — O09899 Supervision of other high risk pregnancies, unspecified trimester: Secondary | ICD-10-CM

## 2020-04-11 NOTE — Progress Notes (Signed)
Patient did not answer phone call

## 2020-05-02 ENCOUNTER — Encounter: Payer: Self-pay | Admitting: Obstetrics

## 2020-05-02 ENCOUNTER — Telehealth (INDEPENDENT_AMBULATORY_CARE_PROVIDER_SITE_OTHER): Payer: Medicaid Other | Admitting: Obstetrics

## 2020-05-02 DIAGNOSIS — O09293 Supervision of pregnancy with other poor reproductive or obstetric history, third trimester: Secondary | ICD-10-CM

## 2020-05-02 DIAGNOSIS — O10913 Unspecified pre-existing hypertension complicating pregnancy, third trimester: Secondary | ICD-10-CM

## 2020-05-02 DIAGNOSIS — D573 Sickle-cell trait: Secondary | ICD-10-CM

## 2020-05-02 DIAGNOSIS — O10919 Unspecified pre-existing hypertension complicating pregnancy, unspecified trimester: Secondary | ICD-10-CM

## 2020-05-02 DIAGNOSIS — O09893 Supervision of other high risk pregnancies, third trimester: Secondary | ICD-10-CM

## 2020-05-02 DIAGNOSIS — O09299 Supervision of pregnancy with other poor reproductive or obstetric history, unspecified trimester: Secondary | ICD-10-CM

## 2020-05-02 DIAGNOSIS — O09899 Supervision of other high risk pregnancies, unspecified trimester: Secondary | ICD-10-CM

## 2020-05-02 DIAGNOSIS — O0993 Supervision of high risk pregnancy, unspecified, third trimester: Secondary | ICD-10-CM

## 2020-05-02 DIAGNOSIS — Z3A35 35 weeks gestation of pregnancy: Secondary | ICD-10-CM

## 2020-05-02 DIAGNOSIS — O0990 Supervision of high risk pregnancy, unspecified, unspecified trimester: Secondary | ICD-10-CM

## 2020-05-02 DIAGNOSIS — Z8632 Personal history of gestational diabetes: Secondary | ICD-10-CM

## 2020-05-02 NOTE — Progress Notes (Signed)
OBSTETRICS PRENATAL VIRTUAL VISIT ENCOUNTER NOTE  Provider location: [facility] [place]   I connected with Abigail Hardin on 05/02/20 at 10:30 AM EST by MyChart Video Encounter at home and verified that I am speaking with the correct person using two identifiers.   I discussed the limitations, risks, security and privacy concerns of performing an evaluation and management service virtually and the availability of in person appointments. I also discussed with the patient that there may be a patient responsible charge related to this service. The patient expressed understanding and agreed to proceed. Subjective:  Abigail Hardin is a 29 y.o. [id number] at [redacted]w[redacted]d being seen today for ongoing prenatal care.  She is currently monitored for the following issues for this high-risk pregnancy and has Supervision of other normal pregnancy, antepartum; Obesity affecting pregnancy, antepartum; Hx of preterm delivery, currently pregnant; Chronic hypertension affecting pregnancy; Hx of gestational diabetes in prior pregnancy, currently pregnant; Depression affecting pregnancy; Sickle cell trait (HCC); and Abnormal chromosomal and genetic finding on antenatal screening mother on their problem list.  Patient reports no complaints.  Contractions: Irregular. Vag. Bleeding: None.  Movement: Present. Denies any leaking of fluid.   The following portions of the patient's history were reviewed and updated as appropriate: allergies, current medications, past family history, past medical history, past social history, past surgical history and problem list.   Objective:  There were no vitals filed for this visit.  Fetal Status:     Movement: Present     General:  Alert, oriented and cooperative. Patient is in no acute distress.  Respiratory: Normal respiratory effort, no problems with respiration noted  Mental Status: Normal mood and affect. Normal behavior. Normal judgment and thought content.    Rest of physical exam deferred due to type of encounter  Imaging: [place] MFM OB FOLLOW UP  Result Date: 04/10/2020 ----------------------------------------------------------------------  OBSTETRICS REPORT                       (Signed Final 04/10/2020 10:22 am) ---------------------------------------------------------------------- Patient Info  ID #:       [id number]                          D.O.B.:  1990/12/10 (29 yrs)  Name:       Abigail Hardin                  Visit Date: 04/10/2020 09:35 am ---------------------------------------------------------------------- Performed By  Attending:        Ma Rings MD         Ref. Address:     Faculty  Performed By:     Sandi Mealy        [facility]  Referred By:      Sharyon Cable CNM ---------------------------------------------------------------------- Orders  #  Description                           Code        Ordered By  1  [place] MFM OB FOLLOW UP                   [id number]    Noralee Space ----------------------------------------------------------------------  #  Order #                     Accession #                Episode #  1  [id number]                   [id number]                 [id number] ---------------------------------------------------------------------- Indications  Obesity complicating pregnancy, second         O99.212  trimester  History of pre-term deliveries                 O09.219  History of sickle cell trait                   Z86.2  Poor obstetric history: Previous gestational   O09.299  diabetes  Hypertension - Chronic/Pre-existing            O10.019  Low risk NIPS//Neg AFP  Other mental disorder complicating             O99.340  pregnancy, second trimester (Zoloft)  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 Cord Insertion:      Visualized  Amniotic Fluid  AFI FV:      Within normal limits  AFI Sum(cm)     %Tile       Largest Pocket(cm)  19.81           75          6.97  RUQ(cm)       RLQ(cm)       LUQ(cm)        LLQ(cm)  2.68          5.12  5.04           6.97 ---------------------------------------------------------------------- Biometry  BPD:      82.1  mm     G. Age:  33w 0d         60  %    CI:        76.81   %    70 - 86                                                          FL/HC:      21.2   %    19.1 - 21.3  HC:      296.7  mm     G. Age:  32w 6d         23  %    HC/AC:      1.00        0.96 - 1.17  AC:      295.8  mm     G. Age:  33w 4d         81  %    FL/BPD:     76.6   %    71 - 87  FL:       62.9  mm     G. Age:  32w 4d         41  %    FL/AC:      21.3   %    20 - 24  HUM:      53.7  mm     G. Age:  31w 2d         31  %  LV:        3.8  mm  Est. FW:    2129  gm    4 lb 11 oz      63  % ---------------------------------------------------------------------- OB History  Gravidity:    6         Term:   1        Prem:   1        SAB:   1  TOP:          2        Living:  2 ---------------------------------------------------------------------- Gestational Age  LMP:           32w 3d        Date:  08/27/19                 EDD:   06/02/20  U/S Today:     33w 0d                                        EDD:   05/29/20  Best:          32w 3d     Det. By:  LMP  (08/27/19)          EDD:   06/02/20 ---------------------------------------------------------------------- Anatomy  Cranium:               Appears  normal         LVOT:  Previously seen  Cavum:                 Appears normal         Aortic Arch:            Previously seen  Ventricles:            Appears normal         Ductal Arch:            Previously seen  Choroid Plexus:        Previously seen        Diaphragm:              Appears normal  Cerebellum:            Previously seen        Stomach:                Appears normal, left                                                                        sided  Posterior Fossa:       Previously seen        Abdomen:                Previously seen  Nuchal Fold:           Previously seen        Abdominal Wall:         Previously seen  Face:                  Orbits and profile     Cord Vessels:           Previously seen                         previously seen  Lips:                  Previously seen        Kidneys:                Appear normal  Palate:                Previously seen        Bladder:                Appears normal  Thoracic:              Appears normal         Spine:                  Previously seen  Heart:                 Previously seen        Upper Extremities:      Previously seen  RVOT:                  Previously seen        Lower Extremities:      Previously seen  Other:  Nasal bone prev visualized. Hands and feet previously visualized.          Heels previously visualized. ----------------------------------------------------------------------  Comments  This patient was seen for a follow up growth scan due to  chronic hypertension that is not currently treated with any  medications.  She denies any problems since her last exam  and reports that she has screened negative for gestational  diabetes.  She was informed that the fetal growth and amniotic fluid  level appears appropriate for her gestational age.  A follow up exam was scheduled in 4 weeks. ----------------------------------------------------------------------                   Ma RingsVictor Fang, MD Electronically Signed Final  Report   04/10/2020 10:22 am ----------------------------------------------------------------------   Assessment and Plan:  Pregnancy: [id number] at 5347w4d 1. Supervision of high risk pregnancy, antepartum  2. Hx of preterm delivery, currently pregnant  3. Hx of gestational diabetes in prior pregnancy, currently pregnant  4. Chronic hypertension affecting pregnancy - clinically stable  5. Sickle cell trait (HCC)   Preterm labor symptoms and general obstetric precautions including but not limited to vaginal bleeding, contractions, leaking of fluid and fetal movement were reviewed in detail with the patient. I discussed the assessment and treatment plan with the patient. The patient was provided an opportunity to ask questions and all were answered. The patient agreed with the plan and demonstrated an understanding of the instructions. The patient was advised to call back or seek an in-person office evaluation/[facility] for any urgent or concerning symptoms. Please refer to After Visit Summary for other counseling recommendations.   I provided 15 minutes of face-to-face time during this encounter.  [facility] 1 [facility] (around 05[facility]09/2020[facility]  Future Appointments  Date Time Provider Department Center  05/08/2020  8:30 AM [facility]  05/08/2020  8:45 AM [facility]  05/09/2020  3:45 PM Warden FillersBass, Lawrence A, MD [facility] None    Coral Ceoharles Yecenia Dalgleish, MD [facility] 05/02/20

## 2020-05-02 NOTE — Progress Notes (Signed)
Pt states she is unable to take a BP at this time.

## 2020-05-08 ENCOUNTER — Encounter: Payer: Self-pay | Admitting: *Deleted

## 2020-05-08 ENCOUNTER — Ambulatory Visit: Payer: Medicaid Other | Admitting: *Deleted

## 2020-05-08 ENCOUNTER — Ambulatory Visit: Payer: Medicaid Other | Attending: Obstetrics and Gynecology

## 2020-05-08 ENCOUNTER — Other Ambulatory Visit: Payer: Self-pay

## 2020-05-08 DIAGNOSIS — O09293 Supervision of pregnancy with other poor reproductive or obstetric history, third trimester: Secondary | ICD-10-CM | POA: Diagnosis not present

## 2020-05-08 DIAGNOSIS — D573 Sickle-cell trait: Secondary | ICD-10-CM | POA: Insufficient documentation

## 2020-05-08 DIAGNOSIS — O10919 Unspecified pre-existing hypertension complicating pregnancy, unspecified trimester: Secondary | ICD-10-CM

## 2020-05-08 DIAGNOSIS — Z3480 Encounter for supervision of other normal pregnancy, unspecified trimester: Secondary | ICD-10-CM | POA: Diagnosis present

## 2020-05-08 DIAGNOSIS — O09213 Supervision of pregnancy with history of pre-term labor, third trimester: Secondary | ICD-10-CM

## 2020-05-08 DIAGNOSIS — O99343 Other mental disorders complicating pregnancy, third trimester: Secondary | ICD-10-CM

## 2020-05-08 DIAGNOSIS — O99213 Obesity complicating pregnancy, third trimester: Secondary | ICD-10-CM | POA: Diagnosis not present

## 2020-05-08 DIAGNOSIS — Z148 Genetic carrier of other disease: Secondary | ICD-10-CM

## 2020-05-08 DIAGNOSIS — O10013 Pre-existing essential hypertension complicating pregnancy, third trimester: Secondary | ICD-10-CM

## 2020-05-08 DIAGNOSIS — Z362 Encounter for other antenatal screening follow-up: Secondary | ICD-10-CM

## 2020-05-08 DIAGNOSIS — Z3A36 36 weeks gestation of pregnancy: Secondary | ICD-10-CM

## 2020-05-08 DIAGNOSIS — E669 Obesity, unspecified: Secondary | ICD-10-CM | POA: Diagnosis not present

## 2020-05-08 DIAGNOSIS — Z862 Personal history of diseases of the blood and blood-forming organs and certain disorders involving the immune mechanism: Secondary | ICD-10-CM

## 2020-05-09 ENCOUNTER — Encounter: Payer: Self-pay | Admitting: Obstetrics and Gynecology

## 2020-05-09 ENCOUNTER — Ambulatory Visit (INDEPENDENT_AMBULATORY_CARE_PROVIDER_SITE_OTHER): Payer: Medicaid Other | Admitting: Obstetrics and Gynecology

## 2020-05-09 ENCOUNTER — Other Ambulatory Visit (HOSPITAL_COMMUNITY)
Admission: RE | Admit: 2020-05-09 | Discharge: 2020-05-09 | Disposition: A | Discharge status: Home / Self Care | Payer: Medicaid Other | Source: Ambulatory Visit | Attending: Obstetrics and Gynecology | Admitting: Obstetrics and Gynecology

## 2020-05-09 VITALS — BP 114/82 | HR 98 | Wt 239.0 lb

## 2020-05-09 DIAGNOSIS — O99340 Other mental disorders complicating pregnancy, unspecified trimester: Secondary | ICD-10-CM

## 2020-05-09 DIAGNOSIS — Z6839 Body mass index (BMI) 39.0-39.9, adult: Secondary | ICD-10-CM

## 2020-05-09 DIAGNOSIS — F32A Depression, unspecified: Secondary | ICD-10-CM

## 2020-05-09 DIAGNOSIS — O0990 Supervision of high risk pregnancy, unspecified, unspecified trimester: Secondary | ICD-10-CM | POA: Insufficient documentation

## 2020-05-09 DIAGNOSIS — O285 Abnormal chromosomal and genetic finding on antenatal screening of mother: Secondary | ICD-10-CM

## 2020-05-09 DIAGNOSIS — O10919 Unspecified pre-existing hypertension complicating pregnancy, unspecified trimester: Secondary | ICD-10-CM

## 2020-05-09 DIAGNOSIS — Z8632 Personal history of gestational diabetes: Secondary | ICD-10-CM

## 2020-05-09 DIAGNOSIS — O09899 Supervision of other high risk pregnancies, unspecified trimester: Secondary | ICD-10-CM

## 2020-05-09 DIAGNOSIS — O09299 Supervision of pregnancy with other poor reproductive or obstetric history, unspecified trimester: Secondary | ICD-10-CM

## 2020-05-09 DIAGNOSIS — Z3480 Encounter for supervision of other normal pregnancy, unspecified trimester: Secondary | ICD-10-CM

## 2020-05-09 DIAGNOSIS — D573 Sickle-cell trait: Secondary | ICD-10-CM

## 2020-05-09 NOTE — Patient Instructions (Addendum)
Group B Streptococcus Test During Pregnancy Why am I having this test? Routine testing, also called screening, for group B streptococcus (GBS) is recommended for all pregnant women between the 36th and 37th week of pregnancy. GBS is a type of bacteria that can be passed from mother to baby during childbirth. Screening will help guide whether or not you will need treatment during labor and delivery to prevent complications such as:  An infection in your uterus during labor.  An infection in your uterus after delivery.  A serious infection in your baby after delivery, such as pneumonia, meningitis, or sepsis. GBS screening is not often done before 36 weeks of pregnancy unless you go into labor prematurely. What happens if I have group B streptococcus? If testing shows that you have GBS, your health care provider will recommend treatment with IV antibiotics during labor and delivery. This treatment significantly decreases the risk of complications for you and your baby. If you have a planned C-section and you have GBS, you may not need to be treated with antibiotics because GBS is usually passed to babies after labor starts and your water breaks. If you are in labor or your water breaks before your C-section, it is possible for GBS to get into your uterus and be passed to your baby, so you might need treatment. Is there a chance I may not need to be tested? You may not need to be tested for GBS if:  You have a urine test that shows GBS before 36 to 37 weeks.  You had a baby with GBS infection after a previous delivery. In these cases, you will automatically be treated for GBS during labor and delivery. What is being tested? This test is done to check if you have group B streptococcus in your vagina or rectum. What kind of sample is taken? To collect samples for this test, your health care provider will swab your vagina and rectum with a cotton swab. The sample is then sent to the lab to see if  GBS is present. What happens during the test?   You will remove your clothing from the waist down.  You will lie down on an exam table in the same position as you would for a pelvic exam.  Your health care provider will swab your vagina and rectum to collect samples for a culture test.  You will be able to go home after the test and do all your usual activities. How are the results reported? The test results are reported as positive or negative. What do the results mean?  A positive test means you are at risk for passing GBS to your baby during labor and delivery. Your health care provider will recommend that you are treated with an IV antibiotic during labor and delivery.  A negative test means you are at very low risk of passing GBS to your baby. There is still a low risk of passing GBS to your baby because sometimes test results may report that you do not have a condition when you do (false-negative result) or there is a chance that you may become infected with GBS after the test is done. You most likely will not need to be treated with an antibiotic during labor and delivery. Talk with your health care provider about what your results mean. Questions to ask your health care provider Ask your health care provider, or the department that is doing the test:  When will my results be ready?  How will I   get my results?  What are my treatment options? Summary  Routine testing (screening) for group B streptococcus (GBS) is recommended for all pregnant women between the 36th and 37th week of pregnancy.  GBS is a type of bacteria that can be passed from mother to baby during childbirth.  If testing shows that you have GBS, your health care provider will recommend that you are treated with IV antibiotics during labor and delivery. This treatment almost always prevents infection in newborns. This information is not intended to replace advice given to you by your health care provider. Make  sure you discuss any questions you have with your health care provider. Document Revised: 09/16/2018 Document Reviewed: 06/23/2018 Elsevier Patient Education  2020 [employer]. Third Trimester of Pregnancy  The third trimester is from week 28 through week 40 (months 7 through 9). This trimester is when your unborn baby (fetus) is growing very fast. At the end of the ninth month, the unborn baby is about 20 inches in length. It weighs about 6-10 pounds. Follow these instructions at home: Medicines  Take over-the-counter and prescription medicines only as told by your doctor. Some medicines are safe and some medicines are not safe during pregnancy.  Take a prenatal vitamin that contains at least 600 micrograms (mcg) of folic acid.  If you have trouble pooping (constipation), take medicine that will make your stool soft (stool softener) if your doctor approves. Eating and drinking   Eat regular, healthy meals.  Avoid raw meat and uncooked cheese.  If you get low calcium from the food you eat, talk to your doctor about taking a daily calcium supplement.  Eat four or five small meals rather than three large meals a day.  Avoid foods that are high in fat and sugars, such as fried and sweet foods.  To prevent constipation: ? Eat foods that are high in fiber, like fresh fruits and vegetables, whole grains, and beans. ? Drink enough fluids to keep your pee (urine) clear or pale yellow. Activity  Exercise only as told by your doctor. Stop exercising if you start to have cramps.  Avoid heavy lifting, wear low heels, and sit up straight.  Do not exercise if it is too hot, too humid, or if you are in a place of great height (high altitude).  You may continue to have sex unless your doctor tells you not to. Relieving pain and discomfort  Wear a good support bra if your breasts are tender.  Take frequent breaks and rest with your legs raised if you have leg cramps or low back  pain.  Take warm water baths (sitz baths) to soothe pain or discomfort caused by hemorrhoids. Use hemorrhoid cream if your doctor approves.  If you develop puffy, bulging veins (varicose veins) in your legs: ? Wear support hose or compression stockings as told by your doctor. ? Raise (elevate) your feet for 15 minutes, 3-4 times a day. ? Limit salt in your food. Safety  Wear your seat belt when driving.  Make a list of emergency phone numbers, including numbers for family, friends, the hospital, and police and fire departments. Preparing for your baby's arrival To prepare for the arrival of your baby:  Take prenatal classes.  Practice driving to the hospital.  Visit the hospital and tour the maternity area.  Talk to your work about taking leave once the baby comes.  Pack your hospital bag.  Prepare the baby's room.  Go to your doctor visits.  Buy a  rear-facing car seat. Learn how to install it in your car. General instructions  Do not use hot tubs, steam rooms, or saunas.  Do not use any products that contain nicotine or tobacco, such as cigarettes and e-cigarettes. If you need help quitting, ask your doctor.  Do not drink alcohol.  Do not douche or use tampons or scented sanitary pads.  Do not cross your legs for long periods of time.  Do not travel for long distances unless you must. Only do so if your doctor says it is okay.  Visit your dentist if you have not gone during your pregnancy. Use a soft toothbrush to brush your teeth. Be gentle when you floss.  Avoid cat litter boxes and soil used by cats. These carry germs that can cause birth defects in the baby and can cause a loss of your baby (miscarriage) or stillbirth.  Keep all your prenatal visits as told by your doctor. This is important. Contact a doctor if:  You are not sure if you are in labor or if your water has broken.  You are dizzy.  You have mild cramps or pressure in your lower belly.  You  have a nagging pain in your belly area.  You continue to feel sick to your stomach, you throw up, or you have watery poop.  You have bad smelling fluid coming from your vagina.  You have pain when you pee. Get help right away if:  You have a fever.  You are leaking fluid from your vagina.  You are spotting or bleeding from your vagina.  You have severe belly cramps or pain.  You lose or gain weight quickly.  You have trouble catching your breath and have chest pain.  You notice sudden or extreme puffiness (swelling) of your face, hands, ankles, feet, or legs.  You have not felt the baby move in over an hour.  You have severe headaches that do not go away with medicine.  You have trouble seeing.  You are leaking, or you are having a gush of fluid, from your vagina before you are 37 weeks.  You have regular belly spasms (contractions) before you are 37 weeks. Summary  The third trimester is from week 28 through week 40 (months 7 through 9). This time is when your unborn baby is growing very fast.  Follow your doctor's advice about medicine, food, and activity.  Get ready for the arrival of your baby by taking prenatal classes, getting all the baby items ready, preparing the baby's room, and visiting your doctor to be checked.  Get help right away if you are bleeding from your vagina, or you have chest pain and trouble catching your breath, or if you have not felt your baby move in over an hour. This information is not intended to replace advice given to you by your health care provider. Make sure you discuss any questions you have with your health care provider. Document Revised: 09/16/2018 Document Reviewed: 07/01/2016 Elsevier Patient Education  2020 [employer].  Labor Induction  Labor induction is when steps are taken to cause a pregnant woman to begin the labor process. Most women go into labor on their own between 37 weeks and 42 weeks of pregnancy. When this does  not happen or when there is a medical need for labor to begin, steps may be taken to induce labor. Labor induction causes a pregnant woman's uterus to contract. It also causes the cervix to soften (ripen), open (dilate), and  thin out (efface). Usually, labor is not induced before 39 weeks of pregnancy unless there is a medical reason to do so. Your health care provider will determine if labor induction is needed. Before inducing labor, your health care provider will consider a number of factors, including:  Your medical condition and your baby's.  How many weeks along you are in your pregnancy.  How mature your baby's lungs are.  The condition of your cervix.  The position of your baby.  The size of your birth canal. What are some reasons for labor induction? Labor may be induced if:  Your health or your baby's health is at risk.  Your pregnancy is overdue by 1 week or more.  Your water breaks but labor does not start on its own.  There is a low amount of amniotic fluid around your baby. You may also choose (elect) to have labor induced at a certain time. Generally, elective labor induction is done no earlier than 39 weeks of pregnancy. What methods are used for labor induction? Methods used for labor induction include:  Prostaglandin medicine. This medicine starts contractions and causes the cervix to dilate and ripen. It can be taken by mouth (orally) or by being inserted into the vagina (suppository).  Inserting a small, thin tube (catheter) with a balloon into the vagina and then expanding the balloon with water to dilate the cervix.  Stripping the membranes. In this method, your health care provider gently separates amniotic sac tissue from the cervix. This causes the cervix to stretch, which in turn causes the release of a hormone called progesterone. The hormone causes the uterus to contract. This procedure is often done during an office visit, after which you will be sent home  to wait for contractions to begin.  Breaking the water. In this method, your health care provider uses a small instrument to make a small hole in the amniotic sac. This eventually causes the amniotic sac to break. Contractions should begin after a few hours.  Medicine to trigger or strengthen contractions. This medicine is given through an IV that is inserted into a vein in your arm. Except for membrane stripping, which can be done in a clinic, labor induction is done in the hospital so that you and your baby can be carefully monitored. How long does it take for labor to be induced? The length of time it takes to induce labor depends on how ready your body is for labor. Some inductions can take up to 2-3 days, while others may take less than a day. Induction may take longer if:  You are induced early in your pregnancy.  It is your first pregnancy.  Your cervix is not ready. What are some risks associated with labor induction? Some risks associated with labor induction include:  Changes in fetal heart rate, such as being too high, too low, or irregular (erratic).  Failed induction.  Infection in the mother or the baby.  Increased risk of having a cesarean delivery.  Fetal death.  Breaking off (abruption) of the placenta from the uterus (rare).  Rupture of the uterus (very rare). When induction is needed for medical reasons, the benefits of induction generally outweigh the risks. What are some reasons for not inducing labor? Labor induction should not be done if:  Your baby does not tolerate contractions.  You have had previous surgeries on your uterus, such as a myomectomy, removal of fibroids, or a vertical scar from a previous cesarean delivery.  Your placenta  lies very low in your uterus and blocks the opening of the cervix (placenta previa).  Your baby is not in a head-down position.  The umbilical cord drops down into the birth canal in front of the baby.  There are  unusual circumstances, such as the baby being very early (premature).  You have had more than 2 previous cesarean deliveries. Summary  Labor induction is when steps are taken to cause a pregnant woman to begin the labor process.  Labor induction causes a pregnant woman's uterus to contract. It also causes the cervix to ripen, dilate, and efface.  Labor is not induced before 39 weeks of pregnancy unless there is a medical reason to do so.  When induction is needed for medical reasons, the benefits of induction generally outweigh the risks. This information is not intended to replace advice given to you by your health care provider. Make sure you discuss any questions you have with your health care provider. Document Revised: 05/29/2017 Document Reviewed: 07/09/2016 Elsevier Patient Education  2020 [employer].

## 2020-05-09 NOTE — Progress Notes (Signed)
   PRENATAL VISIT NOTE  Subjective:  Abigail Hardin is a 29 y.o. [id number] at [redacted]w[redacted]d being seen today for ongoing prenatal care.  She is currently monitored for the following issues for this high-risk pregnancy and has Supervision of other normal pregnancy, antepartum; Obesity affecting pregnancy, antepartum; Hx of preterm delivery, currently pregnant; Chronic hypertension affecting pregnancy; Hx of gestational diabetes in prior pregnancy, currently pregnant; Depression affecting pregnancy; Sickle cell trait (HCC); Abnormal chromosomal and genetic finding on antenatal screening mother; and BMI 39.0-39.9,adult on their problem list.  Patient doing well with no acute concerns today. She reports no complaints.  Contractions: Irritability. Vag. Bleeding: None.  Movement: Present. Denies leaking of fluid.   The following portions of the patient's history were reviewed and updated as appropriate: allergies, current medications, past family history, past medical history, past social history, past surgical history and problem list. Problem list updated.  Objective:   Vitals:   05/09/20 1556  BP: 114/82  Pulse: 98  Weight: 239 lb (108.4 kg)    Fetal Status: Fetal Heart Rate (bpm): 144   Movement: Present  Presentation: Vertex  General:  Alert, oriented and cooperative. Patient is in no acute distress.  Skin: Skin is warm and dry. No rash noted.   Cardiovascular: Normal heart rate noted  Respiratory: Normal respiratory effort, no problems with respiration noted  Abdomen: Soft, gravid, appropriate for gestational age.  Pain/Pressure: Present     Pelvic: Cervical exam performed Dilation: Fingertip Effacement (%): 50 Station: -2  Extremities: Normal range of motion.  Edema: Trace  Mental Status:  Normal mood and affect. Normal behavior. Normal judgment and thought content.   Assessment and Plan:  Pregnancy: [id number] at [redacted]w[redacted]d  1. Supervision of high risk pregnancy, antepartum Routine care -  Cervicovaginal ancillary only( [facility]) - Strep Gp B NAA  2. Chronic hypertension affecting pregnancy BP WNL today, pt CHTN on no meds, last growth at 36%, schedule at 39 weeks for IOL due to antenatal guidelines and holiday scheduling   3. BMI 39.0-39.9,adult   4. Hx of preterm delivery, currently pregnant   5. Hx of gestational diabetes in prior pregnancy, currently pregnant   8. Depression affecting pregnancy Pt in good spirits today with spouse  8. Sickle cell trait (HCC)   9. Abnormal chromosomal and genetic finding on antenatal screening mother   Preterm labor symptoms and general obstetric precautions including but not limited to vaginal bleeding, contractions, leaking of fluid and fetal movement were reviewed in detail with the patient.  Please refer to After Visit Summary for other counseling recommendations.   Return in about 1 week (around 05/16/2020) for in person, [facility].   Mariel Aloe, MD

## 2020-05-09 NOTE — Progress Notes (Signed)
ROB GBS Due Today. Pt would like cervix check today. Pt wants to discuss Induction.   DV:OUZH

## 2020-05-10 LAB — CERVICOVAGINAL ANCILLARY ONLY
Chlamydia: NEGATIVE
Comment: NEGATIVE
Comment: NEGATIVE
Comment: NORMAL
Neisseria Gonorrhea: NEGATIVE
Trichomonas: NEGATIVE

## 2020-05-11 LAB — STREP GP B NAA: Strep Gp B NAA: POSITIVE — AB

## 2020-05-15 ENCOUNTER — Telehealth (HOSPITAL_COMMUNITY): Payer: Self-pay | Admitting: *Deleted

## 2020-05-15 NOTE — Telephone Encounter (Signed)
Preadmission screen  

## 2020-05-16 ENCOUNTER — Telehealth (HOSPITAL_COMMUNITY): Payer: Self-pay | Admitting: *Deleted

## 2020-05-16 ENCOUNTER — Other Ambulatory Visit: Payer: Self-pay

## 2020-05-16 ENCOUNTER — Encounter: Payer: Medicaid Other | Admitting: Obstetrics and Gynecology

## 2020-05-16 ENCOUNTER — Ambulatory Visit (INDEPENDENT_AMBULATORY_CARE_PROVIDER_SITE_OTHER): Payer: Medicaid Other | Admitting: Obstetrics and Gynecology

## 2020-05-16 VITALS — BP 120/76 | HR 84 | Wt 239.8 lb

## 2020-05-16 DIAGNOSIS — Z23 Encounter for immunization: Secondary | ICD-10-CM

## 2020-05-16 DIAGNOSIS — Z3480 Encounter for supervision of other normal pregnancy, unspecified trimester: Secondary | ICD-10-CM

## 2020-05-16 DIAGNOSIS — O10919 Unspecified pre-existing hypertension complicating pregnancy, unspecified trimester: Secondary | ICD-10-CM

## 2020-05-16 NOTE — Telephone Encounter (Signed)
Preadmission screen  

## 2020-05-16 NOTE — Progress Notes (Signed)
   PRENATAL VISIT NOTE  Subjective:  Abigail Hardin is a 29 y.o. [id number] at [redacted]w[redacted]d being seen today for ongoing prenatal care.  She is currently monitored for the following issues for this low-risk pregnancy and has Supervision of other normal pregnancy, antepartum; Obesity affecting pregnancy, antepartum; Hx of preterm delivery, currently pregnant; Chronic hypertension affecting pregnancy; Hx of gestational diabetes in prior pregnancy, currently pregnant; Depression affecting pregnancy; Sickle cell trait (HCC); Abnormal chromosomal and genetic finding on antenatal screening mother; and BMI 39.0-39.9,adult on their problem list.  Patient reports no complaints.  Contractions: Irregular. Vag. Bleeding: None.  Movement: Present. Denies leaking of fluid.   The following portions of the patient's history were reviewed and updated as appropriate: allergies, current medications, past family history, past medical history, past social history, past surgical history and problem list.   Objective:   Vitals:   05/16/20 1452  BP: 120/76  Pulse: 84  Weight: 239 lb 12.8 oz (108.8 kg)    Fetal Status: Fetal Heart Rate (bpm): 135 Fundal Height: 38 cm Movement: Present     General:  Alert, oriented and cooperative. Patient is in no acute distress.  Skin: Skin is warm and dry. No rash noted.   Cardiovascular: Normal heart rate noted  Respiratory: Normal respiratory effort, no problems with respiration noted  Abdomen: Soft, gravid, appropriate for gestational age.  Pain/Pressure: Present     Pelvic: Cervical exam deferred        Extremities: Normal range of motion.  Edema: Trace  Mental Status: Normal mood and affect. Normal behavior. Normal judgment and thought content.   Assessment and Plan:  Pregnancy: [id number] at [redacted]w[redacted]d  1. Supervision of other normal pregnancy, antepartum  Tdap today- previously declined, discussed importance of vaccine GBS positive- will treat in labor, dicussed this with her.   Declined Covid vaccine & Flu vaccine.  Last MFM [place] with normal growth.   2. Chronic hypertension affecting pregnancy  Induction scheduled for 12/18- patient unsure about foley bulb placement. Will think about it and let us know at next visit.  BP good today. Continue BASA until labor.    Preterm labor symptoms and general obstetric precautions including but not limited to vaginal bleeding, contractions, leaking of fluid and fetal movement were reviewed in detail with the patient. Please refer to After Visit Summary for other counseling recommendations.   Return in about 1 week (around 05/23/2020).  Future Appointments  Date Time Provider Department Center  05/24/2020  9:45 AM [facility]-SCREENING [facility]SDSC None  05/26/2020  7:30 AM [facility]-LD SCHED ROOM [facility]INDC None    Venia Carbon, NP

## 2020-05-16 NOTE — Progress Notes (Signed)
Pt reports fetal movement with irregular contractions. 

## 2020-05-17 ENCOUNTER — Telehealth (HOSPITAL_COMMUNITY): Payer: Self-pay | Admitting: *Deleted

## 2020-05-17 NOTE — Telephone Encounter (Signed)
Preadmission screen  

## 2020-05-18 ENCOUNTER — Telehealth (HOSPITAL_COMMUNITY): Payer: Self-pay | Admitting: *Deleted

## 2020-05-18 NOTE — Telephone Encounter (Signed)
Preadmission screen  

## 2020-05-20 ENCOUNTER — Other Ambulatory Visit: Payer: Self-pay | Admitting: Advanced Practice Midwife

## 2020-05-21 ENCOUNTER — Telehealth (HOSPITAL_COMMUNITY): Payer: Self-pay | Admitting: *Deleted

## 2020-05-21 ENCOUNTER — Encounter (HOSPITAL_COMMUNITY): Payer: Self-pay | Admitting: *Deleted

## 2020-05-21 NOTE — Telephone Encounter (Signed)
Preadmission screen  

## 2020-05-22 ENCOUNTER — Other Ambulatory Visit: Payer: Self-pay | Admitting: Advanced Practice Midwife

## 2020-05-23 ENCOUNTER — Encounter: Payer: Self-pay | Admitting: Nurse Practitioner

## 2020-05-23 ENCOUNTER — Other Ambulatory Visit: Payer: Self-pay

## 2020-05-23 ENCOUNTER — Ambulatory Visit (INDEPENDENT_AMBULATORY_CARE_PROVIDER_SITE_OTHER): Payer: Medicaid Other | Admitting: Nurse Practitioner

## 2020-05-23 VITALS — BP 111/75 | HR 86 | Wt 240.0 lb

## 2020-05-23 DIAGNOSIS — O219 Vomiting of pregnancy, unspecified: Secondary | ICD-10-CM

## 2020-05-23 DIAGNOSIS — Z3480 Encounter for supervision of other normal pregnancy, unspecified trimester: Secondary | ICD-10-CM

## 2020-05-23 DIAGNOSIS — Z3A38 38 weeks gestation of pregnancy: Secondary | ICD-10-CM

## 2020-05-23 MED ORDER — ONDANSETRON 4 MG PO TBDP: 4.0000 mg | ORAL_TABLET | Freq: Three times a day (TID) | ORAL | 1 refills | Status: DC | PRN

## 2020-05-23 MED ORDER — SERTRALINE HCL 100 MG PO TABS: 100.0000 mg | ORAL_TABLET | Freq: Every day | ORAL | 3 refills | Status: DC

## 2020-05-23 NOTE — Progress Notes (Signed)
    Subjective:  Abigail Hardin is a 29 y.o. [id number] at [redacted]w[redacted]d being seen today for ongoing prenatal care.  She is currently monitored for the following issues for this low-risk pregnancy and has Supervision of other normal pregnancy, antepartum; Obesity affecting pregnancy, antepartum; Hx of preterm delivery, currently pregnant; Chronic hypertension affecting pregnancy; Hx of gestational diabetes in prior pregnancy, currently pregnant; Depression affecting pregnancy; Sickle cell trait (HCC); Abnormal chromosomal and genetic finding on antenatal screening mother; and BMI 39.0-39.9,adult on their problem list.  Patient reports occasional dizziness.  Contractions: Irritability. Vag. Bleeding: None.  Movement: Present. Denies leaking of fluid.   The following portions of the patient's history were reviewed and updated as appropriate: allergies, current medications, past family history, past medical history, past social history, past surgical history and problem list. Problem list updated.  Objective:   Vitals:   05/23/20 1606  BP: 111/75  Pulse: 86  Weight: 240 lb (108.9 kg)    Fetal Status: Fetal Heart Rate (bpm): 160 Fundal Height: 38 cm Movement: Present     General:  Alert, oriented and cooperative. Patient is in no acute distress.  Skin: Skin is warm and dry. No rash noted.   Cardiovascular: Normal heart rate noted  Respiratory: Normal respiratory effort, no problems with respiration noted  Abdomen: Soft, gravid, appropriate for gestational age. Pain/Pressure: Present     Pelvic:  Cervical exam deferred        Extremities: Normal range of motion.  Edema: Trace  Mental Status: Normal mood and affect. Normal behavior. Normal judgment and thought content.   Urinalysis:      Assessment and Plan:  Pregnancy: [id number] at [redacted]w[redacted]d  1. Supervision of other normal pregnancy, antepartum Declines foley bulb Has Covid testing tomorrow and reviewed quarantine after the test  2. Nausea and  vomiting during pregnancy Refilled meds - ondansetron (ZOFRAN ODT) 4 MG disintegrating tablet; Take 1 tablet (4 mg total) by mouth every 8 (eight) hours as needed for nausea or vomiting.  Dispense: 30 tablet; Refill: 1  3. Some dizziness and headache No severe symptoms.  Eating protein well and regular meals.  Term labor symptoms and general obstetric precautions including but not limited to vaginal bleeding, contractions, leaking of fluid and fetal movement were reviewed in detail with the patient. Please refer to After Visit Summary for other counseling recommendations.  Return in about 4 weeks (around 06/20/2020) for postpartum visit.  Nolene Bernheim, RN, MSN, NP-BC Nurse Practitioner, [facility] for [employer], [facility] 05/23/2020 4:26 PM

## 2020-05-23 NOTE — Progress Notes (Signed)
ROB  CC: dizziness , HA's.   Pt declines cervix check today.   *Pt wants refill on Zoloft and Nausea Rx.

## 2020-05-24 ENCOUNTER — Other Ambulatory Visit (HOSPITAL_COMMUNITY)
Admission: RE | Admit: 2020-05-24 | Discharge: 2020-05-24 | Disposition: A | Discharge status: Home / Self Care | Payer: Medicaid Other | Source: Ambulatory Visit | Attending: Family Medicine | Admitting: Family Medicine

## 2020-05-24 DIAGNOSIS — Z01812 Encounter for preprocedural laboratory examination: Secondary | ICD-10-CM | POA: Insufficient documentation

## 2020-05-24 DIAGNOSIS — Z20822 Contact with and (suspected) exposure to covid-19: Secondary | ICD-10-CM | POA: Insufficient documentation

## 2020-05-24 LAB — SARS CORONAVIRUS 2 (TAT 6-24 HRS): SARS Coronavirus 2: NEGATIVE

## 2020-05-26 ENCOUNTER — Inpatient Hospital Stay (HOSPITAL_COMMUNITY)
Admission: AD | Admit: 2020-05-26 | Discharge: 2020-05-28 | DRG: 807 | Disposition: A | Discharge status: Home / Self Care | Payer: Medicaid Other | Attending: Obstetrics & Gynecology | Admitting: Obstetrics & Gynecology

## 2020-05-26 ENCOUNTER — Other Ambulatory Visit: Payer: Self-pay | Admitting: Obstetrics and Gynecology

## 2020-05-26 ENCOUNTER — Inpatient Hospital Stay (HOSPITAL_COMMUNITY): Payer: Medicaid Other | Admitting: Anesthesiology

## 2020-05-26 ENCOUNTER — Other Ambulatory Visit: Payer: Self-pay

## 2020-05-26 ENCOUNTER — Inpatient Hospital Stay (HOSPITAL_COMMUNITY)
Admission: AD | Admit: 2020-05-26 | Payer: Medicaid Other | Source: Home / Self Care | Admitting: Obstetrics and Gynecology

## 2020-05-26 ENCOUNTER — Inpatient Hospital Stay (HOSPITAL_COMMUNITY): Payer: Medicaid Other | Attending: Obstetrics and Gynecology

## 2020-05-26 ENCOUNTER — Encounter (HOSPITAL_COMMUNITY): Payer: Self-pay | Admitting: Obstetrics & Gynecology

## 2020-05-26 DIAGNOSIS — Z79899 Other long term (current) drug therapy: Secondary | ICD-10-CM

## 2020-05-26 DIAGNOSIS — O99344 Other mental disorders complicating childbirth: Secondary | ICD-10-CM | POA: Diagnosis present

## 2020-05-26 DIAGNOSIS — Z87891 Personal history of nicotine dependence: Secondary | ICD-10-CM

## 2020-05-26 DIAGNOSIS — Z6839 Body mass index (BMI) 39.0-39.9, adult: Secondary | ICD-10-CM

## 2020-05-26 DIAGNOSIS — D573 Sickle-cell trait: Secondary | ICD-10-CM | POA: Diagnosis present

## 2020-05-26 DIAGNOSIS — O09899 Supervision of other high risk pregnancies, unspecified trimester: Secondary | ICD-10-CM

## 2020-05-26 DIAGNOSIS — F32A Depression, unspecified: Secondary | ICD-10-CM | POA: Diagnosis present

## 2020-05-26 DIAGNOSIS — Z3A39 39 weeks gestation of pregnancy: Secondary | ICD-10-CM

## 2020-05-26 DIAGNOSIS — Z8632 Personal history of gestational diabetes: Secondary | ICD-10-CM

## 2020-05-26 DIAGNOSIS — O99824 Streptococcus B carrier state complicating childbirth: Secondary | ICD-10-CM | POA: Diagnosis present

## 2020-05-26 DIAGNOSIS — O99214 Obesity complicating childbirth: Secondary | ICD-10-CM | POA: Diagnosis present

## 2020-05-26 DIAGNOSIS — O285 Abnormal chromosomal and genetic finding on antenatal screening of mother: Secondary | ICD-10-CM | POA: Diagnosis present

## 2020-05-26 DIAGNOSIS — O99340 Other mental disorders complicating pregnancy, unspecified trimester: Secondary | ICD-10-CM | POA: Diagnosis present

## 2020-05-26 DIAGNOSIS — Z3480 Encounter for supervision of other normal pregnancy, unspecified trimester: Secondary | ICD-10-CM

## 2020-05-26 DIAGNOSIS — F419 Anxiety disorder, unspecified: Secondary | ICD-10-CM | POA: Diagnosis present

## 2020-05-26 DIAGNOSIS — O09299 Supervision of pregnancy with other poor reproductive or obstetric history, unspecified trimester: Secondary | ICD-10-CM

## 2020-05-26 DIAGNOSIS — O10919 Unspecified pre-existing hypertension complicating pregnancy, unspecified trimester: Secondary | ICD-10-CM | POA: Diagnosis present

## 2020-05-26 DIAGNOSIS — O99210 Obesity complicating pregnancy, unspecified trimester: Secondary | ICD-10-CM | POA: Diagnosis present

## 2020-05-26 DIAGNOSIS — O9902 Anemia complicating childbirth: Secondary | ICD-10-CM | POA: Diagnosis present

## 2020-05-26 DIAGNOSIS — O1002 Pre-existing essential hypertension complicating childbirth: Principal | ICD-10-CM | POA: Diagnosis present

## 2020-05-26 LAB — COMPREHENSIVE METABOLIC PANEL
ALT: 13 U/L (ref 0–44)
AST: 18 U/L (ref 15–41)
Albumin: 2.7 g/dL — ABNORMAL LOW (ref 3.5–5.0)
Alkaline Phosphatase: 328 U/L — ABNORMAL HIGH (ref 38–126)
Anion gap: 10 (ref 5–15)
BUN: 6 mg/dL (ref 6–20)
CO2: 25 mmol/L (ref 22–32)
Calcium: 8.7 mg/dL — ABNORMAL LOW (ref 8.9–10.3)
Chloride: 101 mmol/L (ref 98–111)
Creatinine, Ser: 0.66 mg/dL (ref 0.44–1.00)
GFR, Estimated: >60 mL/min (ref >60)
Glucose, Bld: 80 mg/dL (ref 70–99)
Potassium: 4 mmol/L (ref 3.5–5.1)
Sodium: 136 mmol/L (ref 135–145)
Total Bilirubin: 0.9 mg/dL (ref 0.3–1.2)
Total Protein: 6.3 g/dL — ABNORMAL LOW (ref 6.5–8.1)

## 2020-05-26 LAB — PROTEIN / CREATININE RATIO, URINE
Creatinine, Urine: 79.57 mg/dL
Protein Creatinine Ratio: 0.1 mg/mg{Cre} (ref 0.00–0.15)
Total Protein, Urine: 8 mg/dL

## 2020-05-26 LAB — CBC
HCT: 36.6 % (ref 36.0–46.0)
HCT: 32.9 % — ABNORMAL LOW (ref 36.0–46.0)
Hemoglobin: 12.3 g/dL (ref 12.0–15.0)
Hemoglobin: 11.9 g/dL — ABNORMAL LOW (ref 12.0–15.0)
MCH: 31.2 pg (ref 26.0–34.0)
MCH: 32.5 pg (ref 26.0–34.0)
MCHC: 33.6 g/dL (ref 30.0–36.0)
MCHC: 36.2 g/dL — ABNORMAL HIGH (ref 30.0–36.0)
MCV: 92.9 fL (ref 80.0–100.0)
MCV: 89.9 fL (ref 80.0–100.0)
Platelets: 158 10*3/uL (ref 150–400)
Platelets: 159 10*3/uL (ref 150–400)
RBC: 3.94 MIL/uL (ref 3.87–5.11)
RBC: 3.66 MIL/uL — ABNORMAL LOW (ref 3.87–5.11)
RDW: 13.2 % (ref 11.5–15.5)
RDW: 13.1 % (ref 11.5–15.5)
WBC: 7.6 10*3/uL (ref 4.0–10.5)
WBC: 8.9 10*3/uL (ref 4.0–10.5)
nRBC: 0 % (ref 0.0–0.2)
nRBC: 0 % (ref 0.0–0.2)

## 2020-05-26 LAB — RPR: RPR Ser Ql: NONREACTIVE

## 2020-05-26 LAB — TYPE AND SCREEN
ABO/RH(D): O POS
Antibody Screen: NEGATIVE

## 2020-05-26 LAB — AMNISURE RUPTURE OF MEMBRANE (ROM) NOT AT ARMC: Amnisure ROM: NEGATIVE

## 2020-05-26 LAB — POCT FERN TEST: POCT Fern Test: NEGATIVE

## 2020-05-26 MED ORDER — OXYTOCIN-SODIUM CHLORIDE 30-0.9 UT/500ML-% IV SOLN
2.5000 [IU]/h | INTRAVENOUS | Status: DC
  Administered 2020-05-26: 22:00:00 2.5 [IU]/h via INTRAVENOUS

## 2020-05-26 MED ORDER — SENNOSIDES-DOCUSATE SODIUM 8.6-50 MG PO TABS
2.0000 | ORAL_TABLET | Freq: Every day | ORAL | Status: DC
  Administered 2020-05-27 – 2020-05-28 (×2): 2 via ORAL
  Filled 2020-05-26 (×2): qty 2

## 2020-05-26 MED ORDER — ONDANSETRON HCL 4 MG/2ML IJ SOLN
4.0000 mg | INTRAMUSCULAR | Status: DC | PRN
  Administered 2020-05-27: 4 mg via INTRAVENOUS
  Filled 2020-05-26: qty 2

## 2020-05-26 MED ORDER — PHENYLEPHRINE 40 MCG/ML (10ML) SYRINGE FOR IV PUSH (FOR BLOOD PRESSURE SUPPORT): 80.0000 ug | PREFILLED_SYRINGE | INTRAVENOUS | Status: DC | PRN

## 2020-05-26 MED ORDER — BENZOCAINE-MENTHOL 20-0.5 % EX AERO: 1.0000 "application " | INHALATION_SPRAY | CUTANEOUS | Status: DC | PRN

## 2020-05-26 MED ORDER — OXYTOCIN BOLUS FROM INFUSION
333.0000 mL | Freq: Once | INTRAVENOUS | Status: DC
  Administered 2020-05-26: 21:00:00 333 mL via INTRAVENOUS

## 2020-05-26 MED ORDER — SODIUM CHLORIDE 0.9 % IV SOLN
5.0000 10*6.[IU] | Freq: Once | INTRAVENOUS | Status: AC
  Administered 2020-05-26: 15:00:00 5 10*6.[IU] via INTRAVENOUS
  Filled 2020-05-26: qty 5

## 2020-05-26 MED ORDER — LACTATED RINGERS IV SOLN: 500.0000 mL | INTRAVENOUS | Status: DC | PRN

## 2020-05-26 MED ORDER — ACETAMINOPHEN 325 MG PO TABS
650.0000 mg | ORAL_TABLET | Freq: Four times a day (QID) | ORAL | Status: DC
  Administered 2020-05-27 – 2020-05-28 (×5): 650 mg via ORAL
  Filled 2020-05-26 (×5): qty 2

## 2020-05-26 MED ORDER — PRENATAL MULTIVITAMIN CH
1.0000 | ORAL_TABLET | Freq: Every day | ORAL | Status: DC
  Administered 2020-05-27: 12:00:00 1 via ORAL
  Filled 2020-05-26: qty 1

## 2020-05-26 MED ORDER — LIDOCAINE HCL (PF) 1 % IJ SOLN: 30.0000 mL | INTRAMUSCULAR | Status: DC | PRN

## 2020-05-26 MED ORDER — ONDANSETRON HCL 4 MG/2ML IJ SOLN
4.0000 mg | Freq: Four times a day (QID) | INTRAMUSCULAR | Status: DC | PRN
Start: 2020-05-26 — End: 2020-05-26
  Administered 2020-05-26: 16:00:00 4 mg via INTRAVENOUS
  Filled 2020-05-26: qty 2

## 2020-05-26 MED ORDER — SODIUM CHLORIDE (PF) 0.9 % IJ SOLN
INTRAMUSCULAR | Status: DC | PRN
  Administered 2020-05-26: 12 mL/h via EPIDURAL

## 2020-05-26 MED ORDER — EPHEDRINE 5 MG/ML INJ: 10.0000 mg | INTRAVENOUS | Status: DC | PRN

## 2020-05-26 MED ORDER — LACTATED RINGERS IV SOLN: INTRAVENOUS | Status: DC

## 2020-05-26 MED ORDER — WITCH HAZEL-GLYCERIN EX PADS
1.0000 | MEDICATED_PAD | CUTANEOUS | Status: DC | PRN
Start: 2020-05-26 — End: 2020-05-28

## 2020-05-26 MED ORDER — OXYCODONE-ACETAMINOPHEN 5-325 MG PO TABS: 2.0000 | ORAL_TABLET | ORAL | Status: DC | PRN

## 2020-05-26 MED ORDER — TERBUTALINE SULFATE 1 MG/ML IJ SOLN
0.2500 mg | Freq: Once | INTRAMUSCULAR | Status: DC | PRN
Start: 2020-05-26 — End: 2020-05-26

## 2020-05-26 MED ORDER — DIPHENHYDRAMINE HCL 25 MG PO CAPS
25.0000 mg | ORAL_CAPSULE | Freq: Four times a day (QID) | ORAL | Status: DC | PRN
  Administered 2020-05-27: 01:00:00 25 mg via ORAL
  Filled 2020-05-26: qty 1

## 2020-05-26 MED ORDER — OXYTOCIN-SODIUM CHLORIDE 30-0.9 UT/500ML-% IV SOLN
1.0000 m[IU]/min | INTRAVENOUS | Status: DC
  Administered 2020-05-26 (×2): 2 m[IU]/min via INTRAVENOUS
  Filled 2020-05-26: qty 500

## 2020-05-26 MED ORDER — IBUPROFEN 600 MG PO TABS
600.0000 mg | ORAL_TABLET | Freq: Four times a day (QID) | ORAL | Status: DC
  Administered 2020-05-27 – 2020-05-28 (×6): 600 mg via ORAL
  Filled 2020-05-26 (×6): qty 1

## 2020-05-26 MED ORDER — SERTRALINE HCL 100 MG PO TABS
100.0000 mg | ORAL_TABLET | Freq: Every day | ORAL | Status: DC
  Administered 2020-05-27 – 2020-05-28 (×2): 100 mg via ORAL
  Filled 2020-05-26 (×2): qty 1

## 2020-05-26 MED ORDER — DIPHENHYDRAMINE HCL 50 MG/ML IJ SOLN: 12.5000 mg | INTRAMUSCULAR | Status: DC | PRN

## 2020-05-26 MED ORDER — SIMETHICONE 80 MG PO CHEW: 80.0000 mg | CHEWABLE_TABLET | ORAL | Status: DC | PRN

## 2020-05-26 MED ORDER — OXYCODONE-ACETAMINOPHEN 5-325 MG PO TABS: 1.0000 | ORAL_TABLET | ORAL | Status: DC | PRN

## 2020-05-26 MED ORDER — COCONUT OIL OIL: 1.0000 "application " | TOPICAL_OIL | Status: DC | PRN

## 2020-05-26 MED ORDER — FENTANYL-BUPIVACAINE-NACL 0.5-0.125-0.9 MG/250ML-% EP SOLN
12.0000 mL/h | EPIDURAL | Status: DC | PRN
  Filled 2020-05-26: qty 250

## 2020-05-26 MED ORDER — ONDANSETRON HCL 4 MG PO TABS: 4.0000 mg | ORAL_TABLET | ORAL | Status: DC | PRN

## 2020-05-26 MED ORDER — MISOPROSTOL 25 MCG QUARTER TABLET
25.0000 ug | ORAL_TABLET | ORAL | Status: DC | PRN
Start: 2020-05-26 — End: 2020-05-26

## 2020-05-26 MED ORDER — LIDOCAINE HCL (PF) 1 % IJ SOLN
INTRAMUSCULAR | Status: DC | PRN
  Administered 2020-05-26: 7 mL via EPIDURAL

## 2020-05-26 MED ORDER — SOD CITRATE-CITRIC ACID 500-334 MG/5ML PO SOLN
30.0000 mL | ORAL | Status: DC | PRN
Start: 2020-05-26 — End: 2020-05-26

## 2020-05-26 MED ORDER — DIBUCAINE (PERIANAL) 1 % EX OINT: 1.0000 "application " | TOPICAL_OINTMENT | CUTANEOUS | Status: DC | PRN

## 2020-05-26 MED ORDER — PENICILLIN G POT IN DEXTROSE 60000 UNIT/ML IV SOLN
3.0000 10*6.[IU] | INTRAVENOUS | Status: DC
  Administered 2020-05-26: 20:00:00 3 10*6.[IU] via INTRAVENOUS
  Filled 2020-05-26 (×2): qty 50

## 2020-05-26 MED ORDER — MISOPROSTOL 50MCG HALF TABLET
50.0000 ug | ORAL_TABLET | ORAL | Status: DC | PRN
  Administered 2020-05-26: 11:00:00 50 ug via BUCCAL
  Filled 2020-05-26: qty 1

## 2020-05-26 MED ORDER — ACETAMINOPHEN 325 MG PO TABS
650.0000 mg | ORAL_TABLET | ORAL | Status: DC | PRN
Start: 2020-05-26 — End: 2020-05-26

## 2020-05-26 MED ORDER — FENTANYL CITRATE (PF) 100 MCG/2ML IJ SOLN
100.0000 ug | INTRAMUSCULAR | Status: DC | PRN
  Administered 2020-05-26: 100 ug via INTRAVENOUS
  Filled 2020-05-26: qty 2

## 2020-05-26 MED ORDER — LACTATED RINGERS IV SOLN
500.0000 mL | Freq: Once | INTRAVENOUS | Status: AC
  Administered 2020-05-26: 18:00:00 500 mL via INTRAVENOUS

## 2020-05-26 MED ORDER — TETANUS-DIPHTH-ACELL PERTUSSIS 5-2.5-18.5 LF-MCG/0.5 IM SUSY: 0.5000 mL | PREFILLED_SYRINGE | Freq: Once | INTRAMUSCULAR | Status: DC

## 2020-05-26 NOTE — MAU Provider Note (Signed)
S: Ms. Abigail Hardin is a 29 y.o. [id number] at 108w0d  who presents to MAU today complaining of possibly leaked fluid once. She denies vaginal bleeding. She endorses contractions. She reports normal fetal movement.    O: BP 136/74 (BP Location: Right Arm)   Pulse 94   Temp 97.6 F (36.4 C) (Oral)   Resp 18   LMP 08/27/2019   SpO2 96%  GENERAL: Well-developed, well-nourished female in no acute distress.  HEAD: Normocephalic, atraumatic.  CHEST: Normal effort of breathing, regular heart rate ABDOMEN: Soft, nontender, gravid PELVIC: Normal external female genitalia. Vagina is pink and rugated. Cervix with normal contour, no lesions. Normal discharge.  Amnisure done by RN.   Cervical exam:  Dilation: 1 Exam by:: n druebbisch rn   Fetal Monitoring: Baseline: 130 Variability: moderate Accelerations: present Decelerations: absent Contractions: regular every 1-5 mins  Results for orders placed or performed during the hospital encounter of 05/26/20 (from the past 24 hour(s))  Amnisure rupture of membrane (rom)not at [facility]     Status: None   Collection Time: 05/26/20  8:42 AM  Result Value Ref Range   Amnisure ROM NEGATIVE   POCT fern test     Status: None   Collection Time: 05/26/20  8:51 AM  Result Value Ref Range   POCT Fern Test Negative = intact amniotic membranes      A: SIUP at [redacted]w[redacted]d  Membranes intact  P: Call Labor team - patient scheduled to be induced Admit to L&D  Raelyn Mora, CNM 05/26/2020, 9:06 AM

## 2020-05-26 NOTE — Anesthesia Procedure Notes (Signed)
Epidural Patient location during procedure: OB Start time: 05/26/2020 6:05 PM End time: 05/26/2020 6:16 PM  Staffing Anesthesiologist: Atilano Median, DO Performed: anesthesiologist   Preanesthetic Checklist Completed: patient identified, IV checked, site marked, risks and benefits discussed, surgical consent, monitors and equipment checked, pre-op evaluation and timeout performed  Epidural Patient position: sitting Prep: ChloraPrep Patient monitoring: heart rate, continuous pulse ox and blood pressure Approach: midline Location: L3-L4 Injection technique: LOR saline  Needle:  Needle type: Tuohy  Needle gauge: 17 G Needle length: 9 cm Needle insertion depth: 8 cm Catheter type: closed end flexible Catheter size: 20 Guage Catheter at skin depth: 13 cm Test dose: negative and 1.5% lidocaine  Assessment Events: blood not aspirated, injection not painful, no injection resistance and no paresthesia  Additional Notes Patient identified. Risks/Benefits/Options discussed with patient including but not limited to bleeding, infection, nerve damage, paralysis, failed block, incomplete pain control, headache, blood pressure changes, nausea, vomiting, reactions to medications, itching and postpartum back pain. Confirmed with bedside nurse the patient's most recent platelet count. Confirmed with patient that they are not currently taking any anticoagulation, have any bleeding history or any family history of bleeding disorders. Patient expressed understanding and wished to proceed. All questions were answered. Sterile technique was used throughout the entire procedure. Please see nursing notes for vital signs. Test dose was given through epidural catheter and negative prior to continuing to dose epidural or start infusion. Warning signs of high block given to the patient including shortness of breath, tingling/numbness in hands, complete motor block, or any concerning symptoms with  instructions to call for help. Patient was given instructions on fall risk and not to get out of bed. All questions and concerns addressed with instructions to call with any issues or inadequate analgesia.    Reason for block:procedure for pain

## 2020-05-26 NOTE — MAU Note (Signed)
Abigail Hardin is a 29 y.o. at [redacted]w[redacted]d here in MAU reporting: states for the last 1-2 hours has been having contractions. States no bleeding but has had 1 episode of leaking. +FM  Onset of complaint: today  Pain score: 6/10  Vitals:   05/26/20 0811  BP: 136/74  Pulse: 94  Resp: 18  Temp: 97.6 F (36.4 C)  SpO2: 96%     FHT: +FM, EFM applied in room  Lab orders placed from triage: none

## 2020-05-26 NOTE — Discharge Summary (Signed)
Postpartum Discharge Summary    Patient Name: Abigail Hardin DOB: [birth year] MRN: [id number]  Date of admission: 05/26/2020 Delivery date:05/26/2020  Delivering provider: Randa Ngo  Date of discharge: 05/28/2020  Admitting diagnosis: Chronic hypertension affecting pregnancy [O10.919] Intrauterine pregnancy: [redacted]w[redacted]d    Secondary diagnosis:  Active Problems:   Supervision of other normal pregnancy, antepartum   Obesity affecting pregnancy, antepartum   Hx of preterm delivery, currently pregnant   Chronic hypertension affecting pregnancy   Hx of gestational diabetes in prior pregnancy, currently pregnant   Depression affecting pregnancy   [facility] ([facility]   Abnormal chromosomal and genetic finding on antenatal screening mother   BMI 39.0-39.9,adult   Vacuum-assisted vaginal delivery  Additional problems: as noted above   Discharge diagnosis: Vacuum assisted vaginal delivery                                         Post partum procedures:N/A Augmentation: Pitocin and Cytotec Complications: Persistent terminal fetal bradycardia with uncomplicated VAVD  Hospital course: Induction of Labor With Vaginal Delivery   29y.o. yo [id number] 357w0das admitted to the hospital 05/26/2020 for induction of labor.  Indication for induction: cHTN.  Patient had an uncomplicated labor course as follows: Membrane Rupture Time/Date: 9:07 PM ,05/26/2020   Delivery Method:Vaginal, Spontaneous  Episiotomy: None  Lacerations:  None  Details of delivery can be found in separate delivery note.  Patient had a routine postpartum course. Patient is discharged home 05/28/20.  Newborn Data: Birth date:05/26/2020  Birth time:9:21 PM  Gender:Female  Living status:Living  Apgars:8 ,9  Weight:2931 g   Magnesium Sulfate received: No BMZ received: No Rhophylac:N/A MMR:N/A T-DaP:Given prenatally Flu: offered prior to discharge Transfusion:No  Physical exam  Vitals:   05/26/20 2215  05/26/20 2230 05/26/20 2245 05/26/20 2300  BP: (!) 125/53 140/75 131/70 135/76  Pulse: 80 70 76 66  Resp: _0 Temp:  97.8 F (36.6 C)    TempSrc:  Oral    SpO2:       General: alert, cooperative and no distress Lochia: appropriate Uterine Fundus: firm Incision: N/A DVT Evaluation: No evidence of DVT seen on physical exam. Labs: Lab Results  Component Value Date   WBC 8.9 05/26/2020   HGB 11.9 (L) 05/26/2020   HCT 32.9 (L) 05/26/2020   MCV 89.9 05/26/2020   PLT 159 05/26/2020   CMP Latest Ref Rng & Units 05/26/2020  Glucose 70 - 99 mg/dL 80  BUN 6 - 20 mg/dL 6  Creatinine 0.44 - 1.00 mg/dL 0.66  Sodium 135 - 145 mmol/L 136  Potassium 3.5 - 5.1 mmol/L 4.0  Chloride 98 - 111 mmol/L 101  CO2 22 - 32 mmol/L 25  Calcium 8.9 - 10.3 mg/dL 8.7(L)  Total Protein 6.5 - 8.1 g/dL 6.3(L)  Total Bilirubin 0.3 - 1.2 mg/dL 0.9  Alkaline Phos 38 - 126 U/L 328(H)  AST 15 - 41 U/L 18  ALT 0 - 44 U/L 13   Edinburgh Score: Edinburgh Postnatal Depression Scale Screening Tool 05/27/2020  I have been able to laugh and see the funny side of things. 1  I have looked forward with enjoyment to things. 3  I have blamed myself unnecessarily when things went wrong. 0  I have been anxious or worried for no good reason. 0  I have felt scared or panicky for  no good reason. 0  Things have been getting on top of me. 2  I have been so unhappy that I have had difficulty sleeping. 0  I have felt sad or miserable. 0  I have been so unhappy that I have been crying. 3  The thought of harming myself has occurred to me. 2  Edinburgh Postnatal Depression Scale Total 11     After visit meds:  Allergies as of 05/28/2020      Reactions   Latex Rash      Medication List    STOP taking these medications   aspirin EC 81 MG tablet   Comfort Fit Maternity Supp Sm Misc   ondansetron 4 MG disintegrating tablet Commonly known as: Zofran ODT     TAKE these medications   acetaminophen 325 MG  tablet Commonly known as: Tylenol Take 2 tablets (650 mg total) by mouth every 4 (four) hours as needed for up to 30 doses for mild pain.   amLODipine 5 MG tablet Commonly known as: NORVASC Take 1 tablet (5 mg total) by mouth daily. Start taking on: May 29, 2020   Blood Pressure Monitor Devi Please check blood pressure 1-2 times per week.   ibuprofen 600 MG tablet Commonly known as: ADVIL Take 1 tablet (600 mg total) by mouth every 6 (six) hours.   norethindrone 0.35 MG tablet Commonly known as: Ortho Micronor Take 1 tablet (0.35 mg total) by mouth daily. TO start at 4 weeks postpartum (around 06/23/2020)   prenatal vitamin w/FE, FA 27-1 MG Tabs tablet Take 1 tablet by mouth daily at 12 noon.   sertraline 100 MG tablet Commonly known as: ZOLOFT Take 1 tablet (100 mg total) by mouth daily.        Discharge home in stable condition Infant Feeding: breast & bottle Infant Disposition:home with mother Discharge instruction: per After Visit Summary and Postpartum booklet. Activity: Advance as tolerated. Pelvic rest for 6 weeks.  Diet: routine diet Future Appointments:No future appointments. Follow up Visit: Message sent to [facility] on 05/26/20 to schedule PP appt.  Please schedule this patient for a In person postpartum visit in 6 weeks with the following provider: Any provider. Additional Postpartum F/U:Postpartum Depression checkup and BP check 1 week  High risk pregnancy complicated by: cHTN, Anxiety/Depression on zoloft, persistent fetal terminal bradycardia with need for vacuum-assisted vaginal delivery Delivery mode: Vacuum-assisted Vaginal, Spontaneous  Anticipated Birth Control:  POPs   Mallie Snooks, MSN, CNM Certified Nurse Midwife, [employer] for [employer], [place] 05/28/20 9:23 AM

## 2020-05-26 NOTE — Progress Notes (Signed)
Pt standing at [facility] while IV attempted.  Continues to be uncomfortable.  Pt up to BR as needed.  FHR doppler taken at [facility] with pt standing.

## 2020-05-26 NOTE — Discharge Instructions (Signed)

## 2020-05-26 NOTE — H&P (Signed)
OBSTETRIC ADMISSION HISTORY AND PHYSICAL  Abigail Hardin is a 29 y.o. female [phone] with IUP at [redacted]w[redacted]d by LMP presenting for IOL-cHTN. She reports +FMs, No LOF, no VB, no blurry vision, headaches or peripheral edema, and RUQ pain.  She plans on breast and bottle feeding. She request POPs for birth control. 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 Anxiety   . History of gestational diabetes   . Hypertension    does not currently take meds    Past Surgical History: Past Surgical History:  Procedure Laterality Date  . DILATION AND CURETTAGE OF UTERUS    . INDUCED ABORTION  04/09/2011  . TONSILLECTOMY      Obstetrical History: OB History    Gravida  6   Para  2   Term  1   Preterm  1   AB  3   Living  2     SAB  1   IAB  2   Ectopic  0   Multiple  0   Live Births  2           Social History Social History   Socioeconomic History  . Marital status: Single    Spouse name: Not on file  . Number of children: Not on file  . Years of education: Not on file  . Highest education level: Not on file  Occupational History  . Not on file  Tobacco Use  . Smoking status: Former Smoker    Packs/day: 0.25    Years: 18.00    Pack years: 4.50    Types: Cigarettes    Quit date: 02/2019    Years since quitting: 1.2  . Smokeless tobacco: Never Used  Vaping Use  . Vaping Use: Never used  Substance and Sexual Activity  . Alcohol use: Not Currently  . Drug use: No  . Sexual activity: Yes  Other Topics Concern  . Not on file  Social History Narrative  . Not on file   Social  Determinants of Health   Financial Resource Strain: Not on file  Food Insecurity: Not on file  Transportation Needs: Not on file  Physical Activity: Not on file  Stress: Not on file  Social Connections: Not on file    Family History: Family History  Problem Relation Age of Onset  . Asthma Daughter   . Asthma Son   . Hypertension Maternal Aunt     Allergies: Allergies  Allergen Reactions  . Latex Rash    Medications Prior to Admission  Medication Sig Dispense Refill Last Dose  . aspirin EC 81 MG tablet Take 1 tablet (81 mg total) by mouth daily. Take after 12 weeks for prevention of preeclampsia later in pregnancy 300 tablet 2   . Blood Pressure Monitor DEVI Please check blood pressure 1-2 times per week. 1 each 0   . Elastic Bandages & Supports (COMFORT FIT MATERNITY SUPP SM) MISC Wear as directed. (Patient not taking: No sig reported) 1 each 0   . ondansetron (ZOFRAN ODT) 4 MG disintegrating tablet Take 1 tablet (4 mg total) by mouth every 8 (eight) hours  as needed for nausea or vomiting. 30 tablet 1   . prenatal vitamin w/FE, FA (PRENATAL 1 + 1) 27-1 MG TABS tablet Take 1 tablet by mouth daily at 12 noon. 30 tablet 0   . sertraline (ZOLOFT) 100 MG tablet Take 1 tablet (100 mg total) by mouth daily. 30 tablet 3      Review of Systems   All systems reviewed and negative except as stated in HPI  Blood pressure 136/74, pulse 94, temperature 97.6 F (36.4 C), temperature source Oral, resp. rate 18, last menstrual period 08/27/2019, SpO2 96 %. General appearance: alert, cooperative and no distress Lungs: normal respiratory effort Heart: regular rate and rhythm Abdomen: soft, non-tender; gravid Pelvic: as noted below Extremities: Homans sign is negative, no sign of DVT Presentation: cephalic per RN exam Fetal monitoringBaseline: 130 bpm, Variability: Good {> 6 bpm), Accelerations: Reactive and Decelerations: Absent Uterine activityFrequency: Every 1-5 minutes Dilation:  1 Exam by:: n druebbisch rn   Prenatal labs: ABO, Rh: O/Positive/-- (06/15 1050) Antibody: Negative (06/15 1050) Rubella: 6.73 (06/15 1050) RPR: Non Reactive (10/20 0918)  HBsAg: Negative (06/15 1050)  HIV: Non Reactive (10/20 0814)  GBS: Positive/-- (12/01 0417)  2 hr Glucola passed Genetic screening  Carrier for sickle cell, fragile x indeterminate  Anatomy US normal but limited, f/u normal  Prenatal Transfer Tool  Maternal Diabetes: No Genetic Screening: Abnormal:  Results: Other:as noted above Maternal Ultrasounds/Referrals: Normal Fetal Ultrasounds or other Referrals:  Referred to Materal Fetal Medicine  Maternal Substance Abuse:  No Significant Maternal Medications:  None Significant Maternal Lab Results: Group B Strep positive  Results for orders placed or performed during the hospital encounter of 05/26/20 (from the past 24 hour(s))  Amnisure rupture of membrane (rom)not at [facility]   Collection Time: 05/26/20  8:42 AM  Result Value Ref Range   Amnisure ROM NEGATIVE   POCT fern test   Collection Time: 05/26/20  8:51 AM  Result Value Ref Range   POCT Fern Test Negative = intact amniotic membranes     Patient Active Problem List   Diagnosis Date Noted  . BMI 39.0-39.9,adult 05/09/2020  . Abnormal chromosomal and genetic finding on antenatal screening mother 12/20/2019  . Sickle cell trait (HCC) 11/29/2019  . Obesity affecting pregnancy, antepartum 11/22/2019  . Hx of preterm delivery, currently pregnant 11/22/2019  . Chronic hypertension affecting pregnancy 11/22/2019  . Hx of gestational diabetes in prior pregnancy, currently pregnant 11/22/2019  . Depression affecting pregnancy 11/22/2019  . Supervision of other normal pregnancy, antepartum 11/14/2019    Assessment/Plan:  Abigail Hardin is a 29 y.o. [id number] at [redacted]w[redacted]d here for IOL-cHTN.  #IOL: Induction process discussed with patient. Will dose cytotec. Risks/benefits of FB discussed with patient, patient  declines at this time. #cHTN: states diagnosed prior to 20 weeks, never on meds. Denies history of HTN prior to pregnancy. Asymptomatic. PreE labs pending. #Pain: PRN #FWB: Cat 1 #ID: GBS pos, PCN #MOF: breast #MOC: POPs #Circ: n/a #Anxiety/depression: continue home zoloft. SW postpartum.  Alric Seton, MD  05/26/2020, 9:43 AM

## 2020-05-26 NOTE — Progress Notes (Signed)
Labor Progress Note Abigail Hardin is a 29 y.o. [id number] at [redacted]w[redacted]d presented for IOL-cHTN.  S: Strip reviewed  O:  BP 134/60   Pulse 77   Temp 97.6 F (36.4 C) (Oral)   Resp 17   LMP 08/27/2019   SpO2 97%  EFM: baseline 120bpm/mod variability/+accels/intermittent late/variable decels Toco: q1-5 min  CVE: Dilation: 10 Dilation Complete Date: 05/26/20 Dilation Complete Time: 1857 Effacement (%): 100 Cervical Position: Middle Station: -2 Presentation: Vertex Exam by:: Polos RN   A&P: 29 y.o. [id number] [redacted]w[redacted]d presented for IOL-cHTN. #IOL: S/p cytotec x1. Currently on pitocin. No current urge to push, will await adequate gbs prophylaxis and then initiate pushing. #Pain: epidural #FWB: cat 2 but overall reassuring, likely in the setting of rapid cervical change #GBS positive, PCN #cHTN: states diagnosed prior to 20 weeks, never on meds. Denies history of HTN prior to pregnancy. Asymptomatic. PreE labs nml, p/c pending. #Anxiety/depression: continue home zoloft. SW postpartum.  Alric Seton, MD 7:42 PM

## 2020-05-26 NOTE — Lactation Note (Signed)
This note was copied from a baby's chart. Lactation Consultation Note  Patient Name: [name] Today's Date: 05/26/2020  [facility] called RN on L&D due to note not being present if mom wanted LC services. Per RN, mom declined LC services in L&D at this time ( no charge).    Maternal Data    Feeding    LATCH Score                   Interventions    Lactation Tools Discussed/Used     Consult Status      Abigail Hardin 05/26/2020, 9:47 PM

## 2020-05-26 NOTE — Progress Notes (Signed)
ANMD at [facility] attempting to place epidural.  Pt is anxious and unable to stay in position during placement.  IV removed when patient jerked during attempted epidural placement.   Epidural placement paused until IV placed.

## 2020-05-26 NOTE — Progress Notes (Addendum)
Labor Progress Note LATEEFAH MALLERY is a 29 y.o. [id number] at [redacted]w[redacted]d presented for IOL-cHTN.  S: Doing well without complaints, feeling contractions.  O:  BP (!) 153/97    Pulse 76    Temp 97.6 F (36.4 C) (Oral)    Resp 17    LMP 08/27/2019    SpO2 97%  EFM: baseline 130bpm/mod variability/+accels/no decels Toco: intermittent  CVE: Dilation: 1 Exam by:: n druebbisch rn   A&P: 29 y.o. [id number] [redacted]w[redacted]d presented for IOL-cHTN. #IOL: S/p cytotec x1. Given cervical exam, will initiate pitocin at this time. #Pain: PRN, desires epidural #FWB: cat 1 #GBS positive, PCN #cHTN: states diagnosed prior to 20 weeks, never on meds. Denies history of HTN prior to pregnancy. Asymptomatic. PreE labs pending. #Anxiety/depression: continue home zoloft. SW postpartum.  Alric Seton, MD 3:23 PM

## 2020-05-26 NOTE — Progress Notes (Signed)
Pt overwhelmed at uncomfortable during contractions despite epidural.  Pt is requesting to delay foley placement and to not push at this time.  MD notified

## 2020-05-26 NOTE — Anesthesia Preprocedure Evaluation (Signed)
Anesthesia Evaluation  Patient identified by MRN, date of birth, ID band Patient awake and Patient confused    Reviewed: NPO status , Patient's Chart, lab work & pertinent test results  Airway Mallampati: II  TM Distance: >3 FB Neck ROM: Full    Dental  (+) Teeth Intact   Pulmonary former smoker,    Pulmonary exam normal        Cardiovascular hypertension, Pt. on medications  Rhythm:Regular Rate:Normal     Neuro/Psych Anxiety negative neurological ROS     GI/Hepatic negative GI ROS, Neg liver ROS,   Endo/Other  Morbid obesity  Renal/GU negative Renal ROS     Musculoskeletal negative musculoskeletal ROS (+)   Abdominal (+)  Abdomen: soft. Bowel sounds: normal.  Peds  Hematology negative hematology ROS (+)   Anesthesia Other Findings   Reproductive/Obstetrics (+) Pregnancy                             Anesthesia Physical Anesthesia Plan  ASA: II  Anesthesia Plan:    Post-op Pain Management:    Induction:   PONV Risk Score and Plan: 2  Airway Management Planned: Natural Airway  Additional Equipment: None  Intra-op Plan:   Post-operative Plan:   Informed Consent: I have reviewed the patients History and Physical, chart, labs and discussed the procedure including the risks, benefits and alternatives for the proposed anesthesia with the patient or authorized representative who has indicated his/her understanding and acceptance.     Dental advisory given  Plan Discussed with:   Anesthesia Plan Comments: (Lab Results      Component                Value               Date                      WBC                      8.9                 05/26/2020                HGB                      11.9 (L)            05/26/2020                HCT                      32.9 (L)            05/26/2020                MCV                      89.9                05/26/2020                 PLT                      159                 05/26/2020          )  Anesthesia Quick Evaluation  

## 2020-05-27 MED ORDER — AMLODIPINE BESYLATE 5 MG PO TABS
5.0000 mg | ORAL_TABLET | Freq: Every day | ORAL | Status: DC
  Administered 2020-05-27 – 2020-05-28 (×2): 5 mg via ORAL
  Filled 2020-05-27 (×2): qty 1

## 2020-05-27 NOTE — Progress Notes (Signed)
CSW received consult due to score 1 on [place] Depression Screen as well as MOB having a hx of anxiety and depression on Zoloft. CSW went to speak with MOB at bedside to address further needs.   CSW congratulated MOB on the birth of infant. CSW advised MOB of the HIPPA policy as CSW noted that MOB had FOB in the room upon CSW attempting to speak with her on first visit this morning. CSW was advised that FOB was in restroom. CSW was advised that if FOB is to leave restroom and enter into room while CSW is speaking with MOB , then it was fine for CSW to keep speaking with MOB.  CSW understanding of this and advised MOB of CSW's role and the reason for CSW coming to speak with her. CSW walked closer to [facility] so that CSW could ask safety questions without FOB hearing. CSW was advised that MOB is not feeling SI, HI and denies being involved in DV to this CSW. CSW understanding and then asked MOB about scoring 11 on [place] with answer 2 to questions number 10. MOB expressed that she answered the [place] incorrectly and denies feeling of SI. MOB expressed that she has never had any feelings of SI and again denies feeling SI to this CSW when asked. CSW understanding and asked MOB about other mental health . MOB expressed that she has a hx of anxiety with being diagnosed in October. MOB expressed that she was seeing a psychiatrist over the phone but reported that she is no longer doing so. MOB expressed that she is now on Zoloft which MOB expressed taking during her pregnancy. MOB reported that Zoloft has been working well for her and that she has no other concerns with her mental health. CSW understanding and asked MOB about other mental health hx in which MOB declines.   CSW was advised that MOB has support from her spouse during this time. MOB reported that she has all needed items to care for infant and reports that infant will sleep in crib/basinet once arrived home. MOB  reported that she has transportation to  and from appointments with no other needs reported to CSW.   CSW provided education regarding Baby Blues vs PMADs and provided MOB with resources for mental health follow up.  CSW encouraged MOB to evaluate her mental health throughout the postpartum period with the use of the New Mom Checklist developed by Postpartum Progress as well as the [place] Postnatal Depression Scale and notify a medical professional if symptoms arise.     Claude Manges Tatiyana Foucher, MSW, LCSW [facility] [phone]

## 2020-05-27 NOTE — Progress Notes (Signed)
POSTPARTUM PROGRESS NOTE  Subjective: Abigail Hardin is a 29 y.o. [id number] s/p vacuum-assisted vaginal delivery at [redacted]w[redacted]d.  She reports she doing well. No acute events overnight. She denies any problems with ambulating, voiding or po intake. Denies nausea or vomiting. She has passed flatus. Pain is well controlled.  Lochia is minimal.  Objective: Blood pressure 127/86, pulse 71, temperature 98.1 F (36.7 C), temperature source Oral, resp. rate 18, last menstrual period 08/27/2019, SpO2 97 %, unknown if currently breastfeeding.  Physical Exam:  General: alert, cooperative and no distress Chest: no respiratory distress Abdomen: soft, non-tender  Uterine Fundus: firm and at level of umbilicus Extremities: No calf swelling or tenderness  no LE edema  Recent Labs    05/26/20 1010 05/26/20 1559  HGB 12.3 11.9*  HCT 36.6 32.9*    Assessment/Plan: Abigail Hardin is a 29 y.o. Q4B2010 s/p vacuum-assisted vaginal delivery at [redacted]w[redacted]d for IOL secondary to [facility].  Routine Postpartum Care: Doing well, pain well-controlled.  -- Continue routine care, lactation support  -- Contraception: POPs -- Feeding: breast & bottle  Dispo: Plan for discharge PPD#2.  Sheila Oats, MD OB Fellow, Faculty [facility] 05/27/2020 8:53 AM

## 2020-05-27 NOTE — Anesthesia Postprocedure Evaluation (Signed)
Anesthesia Post Note  Patient: Abigail Hardin  Procedure(s) Performed: AN AD HOC LABOR EPIDURAL     Patient location during evaluation: Mother Baby Anesthesia Type: Epidural Level of consciousness: awake and alert Pain management: pain level controlled Vital Signs Assessment: post-procedure vital signs reviewed and stable Respiratory status: spontaneous breathing, nonlabored ventilation and respiratory function stable Cardiovascular status: stable Postop Assessment: no headache, no backache and epidural receding Anesthetic complications: no   No complications documented.  Last Vitals:  Vitals:   05/27/20 0113 05/27/20 0510  BP: 128/63 127/86  Pulse: 75 71  Resp: 18 18  Temp: 36.6 C 36.7 C  SpO2:      Last Pain:  Vitals:   05/27/20 0510  TempSrc: Oral  PainSc: 5    Pain Goal:                   Rica Records

## 2020-05-28 DIAGNOSIS — O99345 Other mental disorders complicating the puerperium: Secondary | ICD-10-CM

## 2020-05-28 DIAGNOSIS — O9903 Anemia complicating the puerperium: Secondary | ICD-10-CM

## 2020-05-28 DIAGNOSIS — O10919 Unspecified pre-existing hypertension complicating pregnancy, unspecified trimester: Secondary | ICD-10-CM

## 2020-05-28 DIAGNOSIS — E669 Obesity, unspecified: Secondary | ICD-10-CM

## 2020-05-28 DIAGNOSIS — F329 Major depressive disorder, single episode, unspecified: Secondary | ICD-10-CM

## 2020-05-28 DIAGNOSIS — D573 Sickle-cell trait: Secondary | ICD-10-CM

## 2020-05-28 DIAGNOSIS — Z8759 Personal history of other complications of pregnancy, childbirth and the puerperium: Secondary | ICD-10-CM

## 2020-05-28 DIAGNOSIS — O1093 Unspecified pre-existing hypertension complicating the puerperium: Secondary | ICD-10-CM

## 2020-05-28 MED ORDER — ACETAMINOPHEN 325 MG PO TABS: 650.0000 mg | ORAL_TABLET | ORAL | 0 refills | Status: DC | PRN

## 2020-05-28 MED ORDER — NORETHINDRONE 0.35 MG PO TABS: 1.0000 | ORAL_TABLET | Freq: Every day | ORAL | 11 refills | Status: AC

## 2020-05-28 MED ORDER — AMLODIPINE BESYLATE 5 MG PO TABS: 5.0000 mg | ORAL_TABLET | Freq: Every day | ORAL | 1 refills | Status: AC

## 2020-05-28 MED ORDER — IBUPROFEN 600 MG PO TABS: 600.0000 mg | ORAL_TABLET | Freq: Four times a day (QID) | ORAL | 0 refills | Status: DC

## 2020-06-04 ENCOUNTER — Ambulatory Visit: Payer: Medicaid Other | Admitting: Licensed Clinical Social Worker

## 2020-06-04 ENCOUNTER — Ambulatory Visit: Payer: Medicaid Other

## 2020-07-02 ENCOUNTER — Ambulatory Visit: Payer: Medicaid Other | Admitting: Obstetrics and Gynecology

## 2020-07-12 ENCOUNTER — Ambulatory Visit: Payer: Medicaid Other | Admitting: Obstetrics & Gynecology

## 2021-04-12 IMAGING — US US MFM OB FOLLOW-UP
1 series · 13 of 28 positions shown · non-contrast
Comparison: none

[Series 1: us mfm ob follow-up · 13 of 35 slices shown]
[im 2/35]
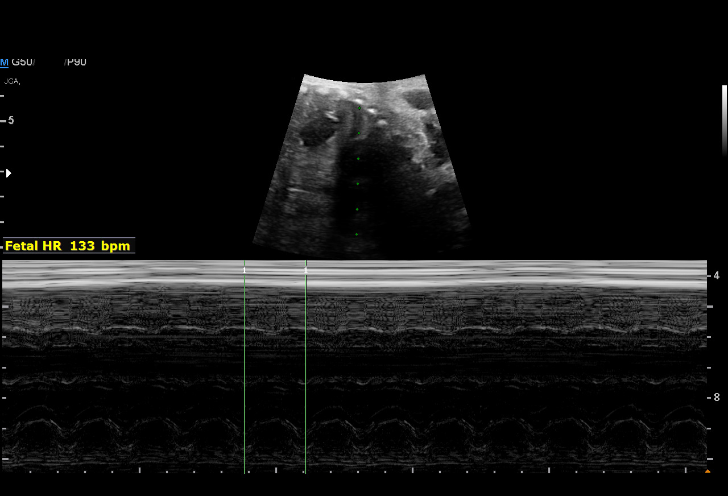
[im 4/35]
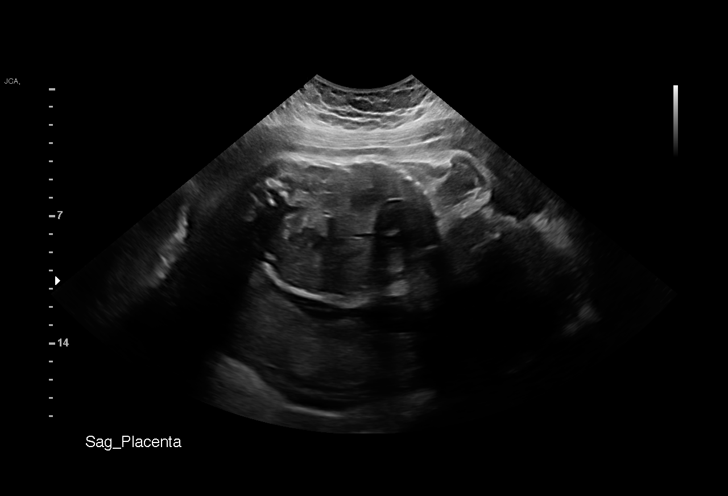
[im 7/35]
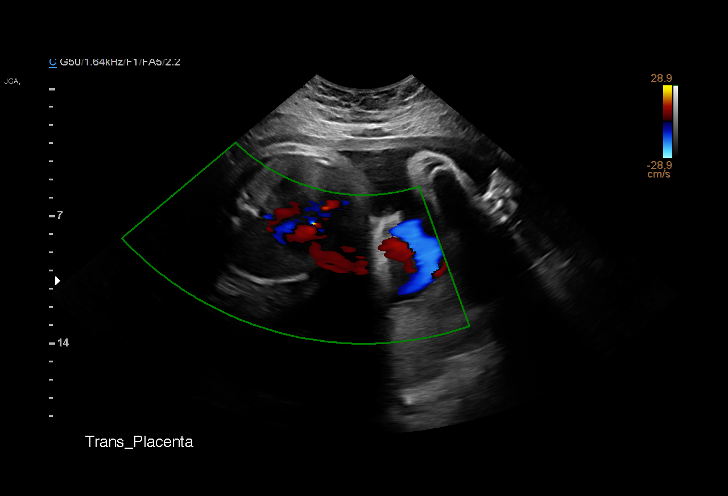
[im 9/35]
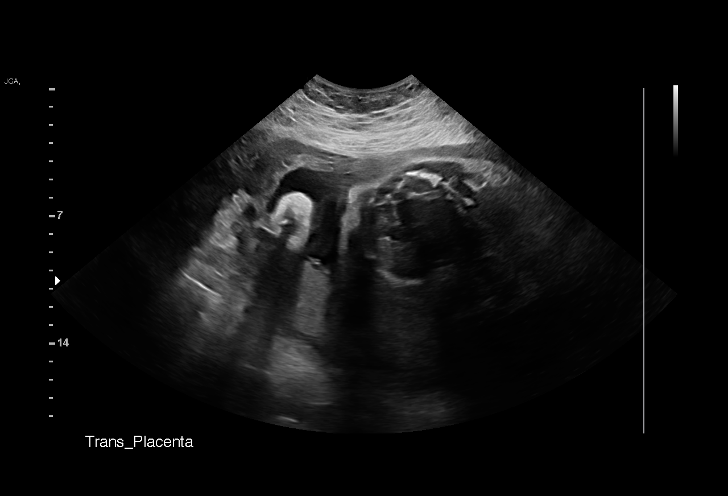
[im 12/35]
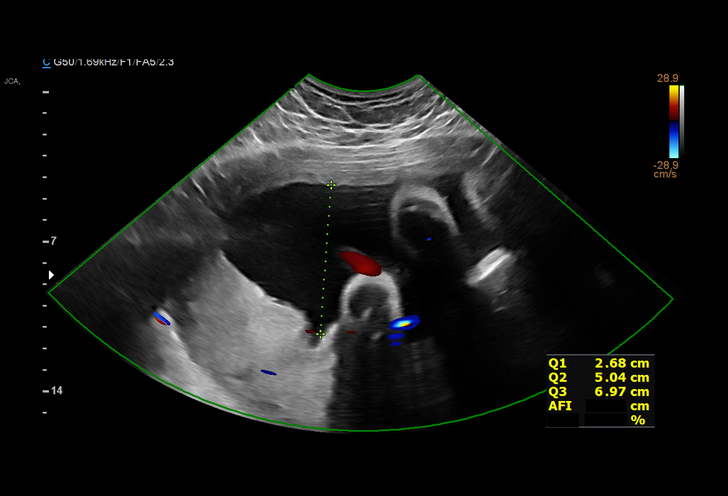
[im 14/35]
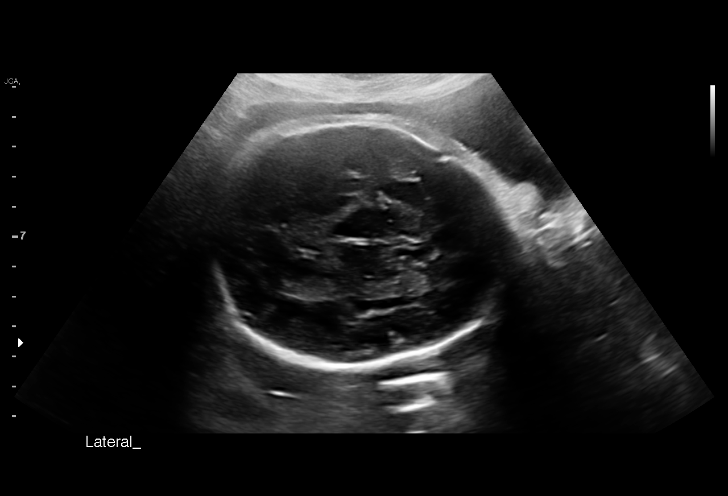
[im 18/35]
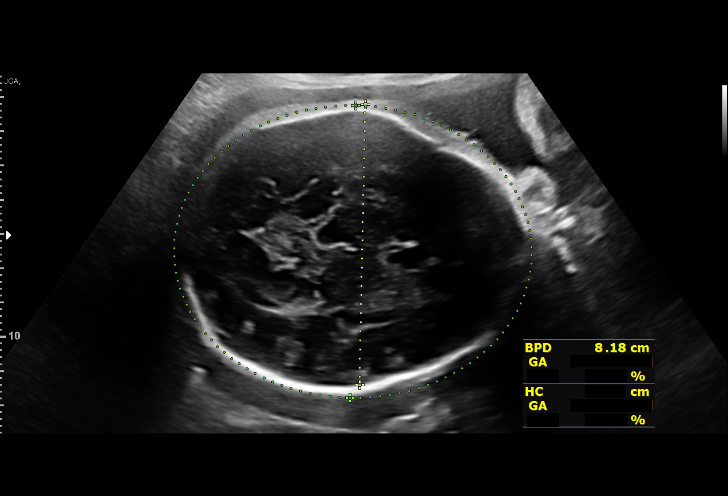
[im 21/35]
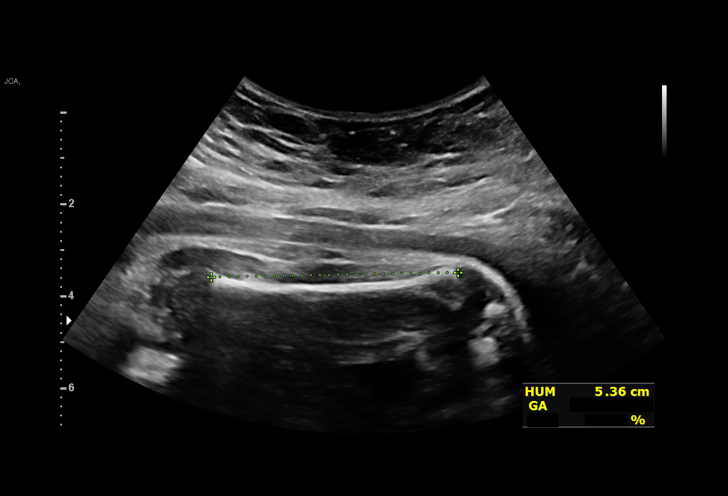
[im 23/35]
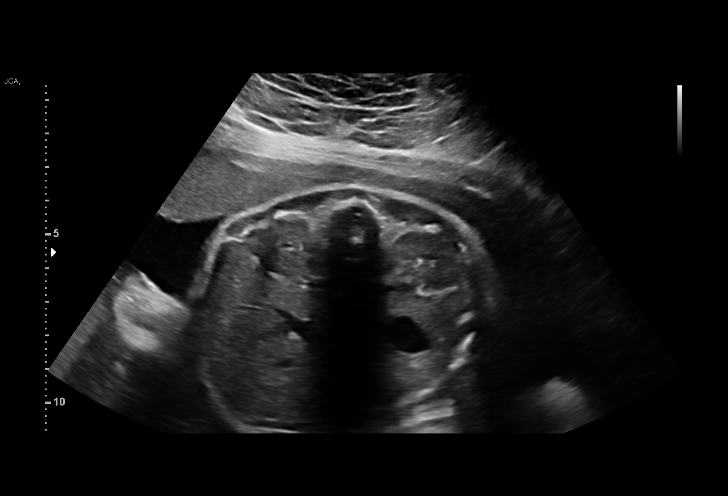
[im 26/35]
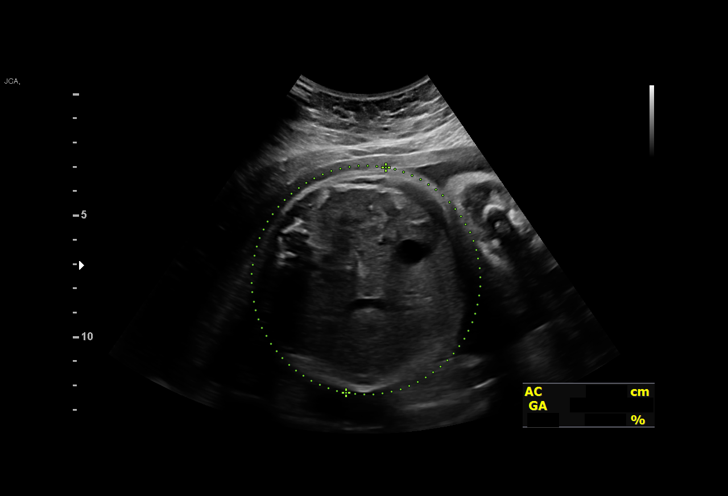
[im 28/35]
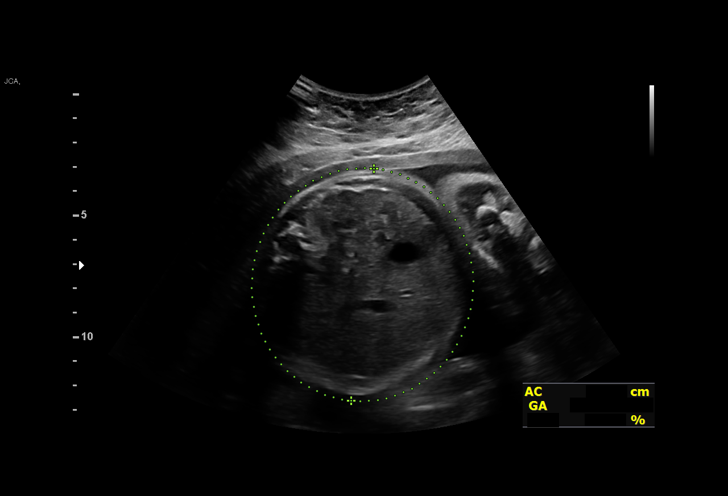
[im 31/35]
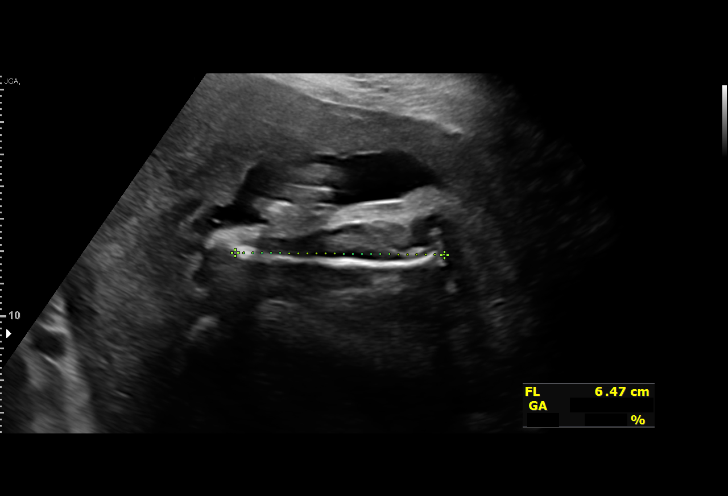
[im 33/35]
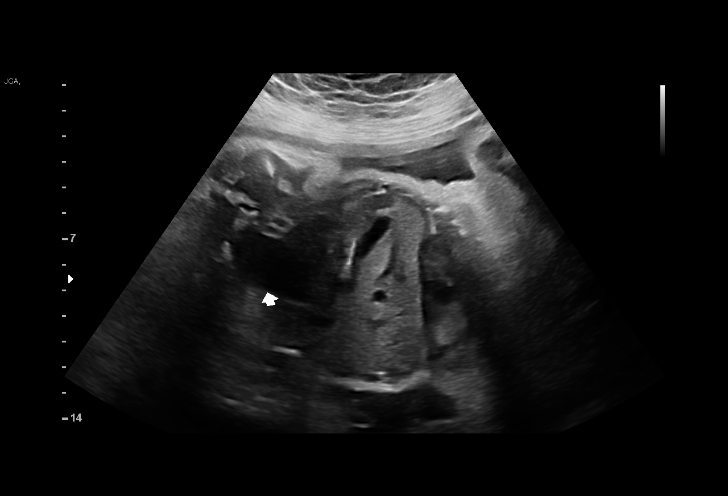

[13 of 28 positions shown; findings below may reference images not displayed]

ULATOMEK CNM

Indications

 Obesity complicating pregnancy, second
 trimester
 History of pre-term deliveries
 History of sickle cell trait
 Poor obstetric history: Previous gestational
 diabetes
 Hypertension - Chronic/Pre-existing
 Low risk NIPS//Neg AFP
 Other mental disorder complicating
 pregnancy, second trimester (Zoloft)
 Genetic carrier (specify)
 Encounter for other antenatal screening
 follow-up
 32 weeks gestation of pregnancy
Fetal Evaluation

 Num Of Fetuses:         1
 Fetal Heart Rate(bpm):  133
 Cardiac Activity:       Observed
 Presentation:           Cephalic
 Placenta:               Posterior
 P. Cord Insertion:      Visualized

 Amniotic Fluid
 AFI FV:      Within normal limits

 AFI Sum(cm)     %Tile       Largest Pocket(cm)
 19.81           75

 RUQ(cm)       RLQ(cm)       LUQ(cm)        LLQ(cm)

Biometry

 BPD:      82.1  mm     G. Age:  33w 0d         60  %    CI:        76.81   %    70 - 86
                                                         FL/HC:      21.2   %    19.1 -
 HC:      296.7  mm     G. Age:  32w 6d         23  %    HC/AC:      1.00        0.96 -
 AC:      295.8  mm     G. Age:  33w 4d         81  %    FL/BPD:     76.6   %    71 - 87
 FL:       62.9  mm     G. Age:  32w 4d         41  %    FL/AC:      21.3   %    20 - 24
 HUM:      53.7  mm     G. Age:  31w 2d         31  %

 LV:        3.8  mm

 Est. FW:    5659  gm    4 lb 11 oz      63  %
OB History

 Gravidity:    6         Term:   1        Prem:   1        SAB:   1
 TOP:          2        Living:  2
Gestational Age

 LMP:           32w 3d        Date:  08/27/19                 EDD:   06/02/20
 U/S Today:     33w 0d                                        EDD:   05/29/20
 Best:          32w 3d     Det. By:  LMP  (08/27/19)          EDD:   06/02/20
Anatomy

 Cranium:               Appears normal         LVOT:                   Previously seen
 Cavum:                 Appears normal         Aortic Arch:            Previously seen
 Ventricles:            Appears normal         Ductal Arch:            Previously seen
 Choroid Plexus:        Previously seen        Diaphragm:              Appears normal
 Cerebellum:            Previously seen        Stomach:                Appears normal, left
                                                                       sided
 Posterior Fossa:       Previously seen        Abdomen:                Previously seen
 Nuchal Fold:           Previously seen        Abdominal Wall:         Previously seen
 Face:                  Orbits and profile     Cord Vessels:           Previously seen
                        previously seen
 Lips:                  Previously seen        Kidneys:                Appear normal
 Palate:                Previously seen        Bladder:                Appears normal
 Thoracic:              Appears normal         Spine:                  Previously seen
 Heart:                 Previously seen        Upper Extremities:      Previously seen
 RVOT:                  Previously seen        Lower Extremities:      Previously seen

 Other:  Nasal bone prev visualized. Hands and feet previously visualized.
         Heels previously visualized.
Comments

 This patient was seen for a follow up growth scan due to
 chronic hypertension that is not currently treated with any
 medications.  She denies any problems since her last exam
 and reports that she has screened negative for gestational
 diabetes.
 She was informed that the fetal growth and amniotic fluid
 level appears appropriate for her gestational age.
 A follow up exam was scheduled in 4 weeks.

## 2021-06-10 ENCOUNTER — Other Ambulatory Visit: Payer: Self-pay | Admitting: Nurse Practitioner

## 2022-01-06 ENCOUNTER — Ambulatory Visit: Payer: Medicaid Other

## 2022-01-07 ENCOUNTER — Ambulatory Visit: Payer: Medicaid Other

## 2022-01-08 ENCOUNTER — Ambulatory Visit: Payer: Medicaid Other

## 2022-01-15 ENCOUNTER — Other Ambulatory Visit (HOSPITAL_COMMUNITY)
Admission: RE | Admit: 2022-01-15 | Discharge: 2022-01-15 | Disposition: A | Discharge status: Home / Self Care | Payer: Medicaid Other | Source: Ambulatory Visit | Attending: Obstetrics and Gynecology | Admitting: Obstetrics and Gynecology

## 2022-01-15 ENCOUNTER — Ambulatory Visit (INDEPENDENT_AMBULATORY_CARE_PROVIDER_SITE_OTHER): Payer: Medicaid Other

## 2022-01-15 DIAGNOSIS — N898 Other specified noninflammatory disorders of vagina: Secondary | ICD-10-CM | POA: Diagnosis present

## 2022-01-15 NOTE — Progress Notes (Signed)
..  SUBJECTIVE:  31 y.o. female complains of  vaginal discharge and odor for 1 week. Denies abnormal vaginal bleeding or significant pelvic pain or fever. No UTI symptoms. Denies history of known exposure to STD, reports new partner.  No LMP recorded.  OBJECTIVE:  She appears well, afebrile. Urine dipstick: not done.  ASSESSMENT:  Vaginal Discharge  Vaginal Odor   PLAN:  GC, chlamydia, trichomonas, BVAG, CVAG probe sent to lab. Treatment: To be determined once lab results are received ROV prn if symptoms persist or worsen. Annual is scheduled for 02/19/22

## 2022-01-16 ENCOUNTER — Other Ambulatory Visit: Payer: Self-pay | Admitting: *Deleted

## 2022-01-16 DIAGNOSIS — N760 Acute vaginitis: Secondary | ICD-10-CM

## 2022-01-16 DIAGNOSIS — A749 Chlamydial infection, unspecified: Secondary | ICD-10-CM

## 2022-01-16 LAB — CERVICOVAGINAL ANCILLARY ONLY
Bacterial Vaginitis (gardnerella): POSITIVE — AB
Candida Glabrata: NEGATIVE
Candida Vaginitis: NEGATIVE
Chlamydia: POSITIVE — AB
Comment: NEGATIVE
Comment: NEGATIVE
Comment: NEGATIVE
Comment: NEGATIVE
Comment: NEGATIVE
Comment: NORMAL
Neisseria Gonorrhea: POSITIVE — AB
Trichomonas: NEGATIVE

## 2022-01-16 LAB — HEPATITIS B SURFACE ANTIGEN: Hepatitis B Surface Ag: NEGATIVE

## 2022-01-16 LAB — HIV ANTIBODY (ROUTINE TESTING W REFLEX): HIV Screen 4th Generation wRfx: NONREACTIVE

## 2022-01-16 LAB — HEPATITIS C ANTIBODY: Hep C Virus Ab: NONREACTIVE

## 2022-01-16 LAB — RPR: RPR Ser Ql: NONREACTIVE

## 2022-01-16 MED ORDER — METRONIDAZOLE 500 MG PO TABS: 500.0000 mg | ORAL_TABLET | Freq: Two times a day (BID) | ORAL | 0 refills | Status: DC

## 2022-01-16 MED ORDER — DOXYCYCLINE HYCLATE 100 MG PO CAPS: 100.0000 mg | ORAL_CAPSULE | Freq: Two times a day (BID) | ORAL | 0 refills | Status: AC

## 2022-01-16 NOTE — Progress Notes (Signed)
Flagyl and Doxycycline sent for +BV and CH Pt schedule for injection for +GC

## 2022-01-21 ENCOUNTER — Ambulatory Visit (INDEPENDENT_AMBULATORY_CARE_PROVIDER_SITE_OTHER): Payer: Medicaid Other

## 2022-01-21 VITALS — BP 141/86 | HR 72 | Ht 65.0 in | Wt 218.7 lb

## 2022-01-21 DIAGNOSIS — A549 Gonococcal infection, unspecified: Secondary | ICD-10-CM

## 2022-01-21 MED ORDER — CEFTRIAXONE SODIUM 500 MG IJ SOLR
500.0000 mg | Freq: Once | INTRAMUSCULAR | Status: AC
  Administered 2022-01-21: 500 mg via INTRAMUSCULAR

## 2022-01-21 NOTE — Progress Notes (Signed)
Agree with nurses's documentation of this patient's clinic encounter.  Ayn Domangue L, MD  

## 2022-01-21 NOTE — Progress Notes (Addendum)
SUBJECTIVE: Abigail Hardin is a 31 y.o. female presents for Rocephin injection for positive gonorrhea.    OBJECTIVE: Appears well, in no apparent distress.  Vital signs are normal.   ASSESSMENT: Positive gonorrhea test.   PLAN: Treatment per orders.  Call or return to clinic prn if these symptoms worsen or fail to improve as anticipated. 500 mg Rocephin injection given in RUOQ. Tolerated well. Return in 2-3 months for TOC.   Administrations This Visit     cefTRIAXone (ROCEPHIN) injection 500 mg     Admin Date 01/21/2022 Action Given Dose 500 mg Route Intramuscular Administered By Bayard Beaver, LPN

## 2022-02-13 ENCOUNTER — Ambulatory Visit (INDEPENDENT_AMBULATORY_CARE_PROVIDER_SITE_OTHER): Payer: Medicaid Other | Admitting: General Practice

## 2022-02-13 ENCOUNTER — Other Ambulatory Visit (HOSPITAL_COMMUNITY)
Admission: RE | Admit: 2022-02-13 | Discharge: 2022-02-13 | Disposition: A | Discharge status: Home / Self Care | Payer: Medicaid Other | Source: Ambulatory Visit | Attending: Obstetrics & Gynecology | Admitting: Obstetrics & Gynecology

## 2022-02-13 VITALS — BP 141/86 | HR 84 | Ht 65.0 in | Wt 221.6 lb

## 2022-02-13 DIAGNOSIS — N898 Other specified noninflammatory disorders of vagina: Secondary | ICD-10-CM | POA: Insufficient documentation

## 2022-02-13 DIAGNOSIS — R300 Dysuria: Secondary | ICD-10-CM

## 2022-02-13 LAB — POCT URINALYSIS DIPSTICK
Bilirubin, UA: NEGATIVE
Blood, UA: NEGATIVE
Glucose, UA: NEGATIVE
Ketones, UA: NEGATIVE
Leukocytes, UA: NEGATIVE
Nitrite, UA: NEGATIVE
Protein, UA: NEGATIVE
Spec Grav, UA: 1.02 (ref 1.010–1.025)
Urobilinogen, UA: NEGATIVE E.U./dL — AB
pH, UA: 6.5 (ref 5.0–8.0)

## 2022-02-13 NOTE — Progress Notes (Signed)
SUBJECTIVE:  31 y.o. female presents for TOC. Denies abnormal vaginal bleeding or significant pelvic pain or fever. C/o UTI symptoms. Pt tested positive for BV, Chlamydia and Gonorrhea 01/15/22.   No LMP recorded.  OBJECTIVE:  She appears well, afebrile. Urine dipstick: negative for all components. Urine culture collected.  ASSESSMENT:  Vaginal Discharge  Vaginal Odor   PLAN:  GC, chlamydia, trichomonas, BVAG, CVAG probe sent to lab. Treatment: To be determined once lab results are received ROV prn if symptoms persist or worsen.

## 2022-02-14 LAB — CERVICOVAGINAL ANCILLARY ONLY
Bacterial Vaginitis (gardnerella): POSITIVE — AB
Candida Glabrata: NEGATIVE
Candida Vaginitis: NEGATIVE
Chlamydia: NEGATIVE
Comment: NEGATIVE
Comment: NEGATIVE
Comment: NEGATIVE
Comment: NEGATIVE
Comment: NEGATIVE
Comment: NORMAL
Neisseria Gonorrhea: NEGATIVE
Trichomonas: NEGATIVE

## 2022-02-19 ENCOUNTER — Encounter: Payer: Self-pay | Admitting: Obstetrics and Gynecology

## 2022-02-19 ENCOUNTER — Other Ambulatory Visit (HOSPITAL_COMMUNITY)
Admission: RE | Admit: 2022-02-19 | Discharge: 2022-02-19 | Disposition: A | Discharge status: Home / Self Care | Payer: Medicaid Other | Source: Ambulatory Visit | Attending: Obstetrics and Gynecology | Admitting: Obstetrics and Gynecology

## 2022-02-19 ENCOUNTER — Ambulatory Visit (INDEPENDENT_AMBULATORY_CARE_PROVIDER_SITE_OTHER): Payer: Medicaid Other | Admitting: Obstetrics and Gynecology

## 2022-02-19 VITALS — BP 150/77 | HR 77 | Ht 65.0 in | Wt 220.4 lb

## 2022-02-19 DIAGNOSIS — I10 Essential (primary) hypertension: Secondary | ICD-10-CM | POA: Insufficient documentation

## 2022-02-19 DIAGNOSIS — Z01419 Encounter for gynecological examination (general) (routine) without abnormal findings: Secondary | ICD-10-CM

## 2022-02-19 DIAGNOSIS — L989 Disorder of the skin and subcutaneous tissue, unspecified: Secondary | ICD-10-CM

## 2022-02-19 DIAGNOSIS — Z30013 Encounter for initial prescription of injectable contraceptive: Secondary | ICD-10-CM

## 2022-02-19 DIAGNOSIS — I159 Secondary hypertension, unspecified: Secondary | ICD-10-CM

## 2022-02-19 LAB — POCT URINE PREGNANCY: Preg Test, Ur: NEGATIVE

## 2022-02-19 MED ORDER — MEDROXYPROGESTERONE ACETATE 150 MG/ML IM SUSP
150.0000 mg | Freq: Once | INTRAMUSCULAR | Status: AC
  Administered 2022-02-19: 150 mg via INTRAMUSCULAR

## 2022-02-19 NOTE — Progress Notes (Signed)
GYNECOLOGY ANNUAL PREVENTATIVE CARE ENCOUNTER NOTE  History:     Abigail Hardin is a 31 y.o. [phone] female here for a routine annual gynecologic exam.  Current complaints: occasional boils in axilla and groin.   Denies abnormal vaginal bleeding, discharge, pelvic pain, problems with intercourse or other gynecologic concerns.    Gynecologic History Patient's last menstrual period was 02/10/2022. Contraception: Depo-Provera injections Last Pap: 6/21. Results were: normal with negative HPV Last mammogram: n/a Obstetric History OB History  Gravida Para Term Preterm AB Living  6 3 2 1 3 3   SAB IAB Ectopic Multiple Live Births  1 2 0 0 3    # Outcome Date GA Lbr Len/2nd Weight Sex Delivery Anes PTL Lv  6 Term 05/26/20 [redacted]w[redacted]d 12:57 / 02:23 6 lb 7.4 oz (2.931 kg) F Vag-Vacuum EPI  LIV  5 SAB 03/10/19     SAB     4 Term 08/18/10 [redacted]w[redacted]d   F Vag-Spont   LIV  3 Preterm 02/17/08 [redacted]w[redacted]d   M Vag-Spont   LIV  2 IAB           1 IAB             Past Medical History:  Diagnosis Date   Anxiety    History of gestational diabetes    Hypertension    does not currently take meds    Past Surgical History:  Procedure Laterality Date   DILATION AND CURETTAGE OF UTERUS     INDUCED ABORTION  04/09/2011   TONSILLECTOMY      Current Outpatient Medications on File Prior to Visit  Medication Sig Dispense Refill   acetaminophen (TYLENOL) 325 MG tablet Take 2 tablets (650 mg total) by mouth every 4 (four) hours as needed for up to 30 doses for mild pain. 30 tablet 0   amLODipine (NORVASC) 5 MG tablet Take 1 tablet (5 mg total) by mouth daily. 30 tablet 1   etonogestrel-ethinyl estradiol (NUVARING) 0.12-0.015 MG/24HR vaginal ring Place 1 each vaginally every 28 (twenty-eight) days. Insert vaginally and leave in place for 3 consecutive weeks, then remove for 1 week.     FLUoxetine (PROZAC) 10 MG tablet Take 30 mg by mouth daily.     gabapentin (NEURONTIN) 300 MG capsule Take 300 mg by mouth 3 (three)  times daily.     ibuprofen (ADVIL) 600 MG tablet Take 1 tablet (600 mg total) by mouth every 6 (six) hours. 30 tablet 0   meloxicam (MOBIC) 15 MG tablet Take 15 mg by mouth daily.     Blood Pressure Monitor DEVI Please check blood pressure 1-2 times per week. (Patient not taking: Reported on 01/15/2022) 1 each 0   metroNIDAZOLE (FLAGYL) 500 MG tablet Take 1 tablet (500 mg total) by mouth 2 (two) times daily. (Patient not taking: Reported on 02/13/2022) 14 tablet 0   norethindrone (ORTHO MICRONOR) 0.35 MG tablet Take 1 tablet (0.35 mg total) by mouth daily. TO start at 4 weeks postpartum (around 06/23/2020) 28 tablet 11   prenatal vitamin w/FE, FA (PRENATAL 1 + 1) 27-1 MG TABS tablet Take 1 tablet by mouth daily at 12 noon. (Patient not taking: Reported on 01/15/2022) 30 tablet 0   sertraline (ZOLOFT) 100 MG tablet Take 1 tablet (100 mg total) by mouth daily. (Patient not taking: Reported on 01/15/2022) 30 tablet 3   No current facility-administered medications on file prior to visit.    Allergies  Allergen Reactions   Latex Rash  Social History:  reports that she has been smoking cigarettes. She has a 4.50 pack-year smoking history. She has never used smokeless tobacco. She reports that she does not currently use alcohol. She reports that she does not use drugs.  Family History  Problem Relation Age of Onset   Asthma Daughter    Asthma Son    Hypertension Maternal Aunt     The following portions of the patient's history were reviewed and updated as appropriate: allergies, current medications, past family history, past medical history, past social history, past surgical history and problem list.  Review of Systems Pertinent items noted in HPI and remainder of comprehensive ROS otherwise negative.  Physical Exam:  BP (!) 150/77   Pulse 77   Ht 5\' 5"  (1.651 m)   Wt 220 lb 6.4 oz (100 kg)   LMP 02/10/2022   BMI 36.68 kg/m  CONSTITUTIONAL: Well-developed, well-nourished female in no acute  distress.  HENT:  Normocephalic, atraumatic, External right and left ear normal. Oropharynx is clear and moist EYES: Conjunctivae and EOM are normal.  NECK: Normal range of motion, supple, no masses.  Normal thyroid.  SKIN: Skin is warm and dry. No rash noted. Not diaphoretic. No erythema. No pallor. Minor scarring in left axilla, one small healing skin lesion in the left groin.  No drainage at either site. MUSCULOSKELETAL: Normal range of motion. No tenderness.  No cyanosis, clubbing, or edema.  2+ distal pulses. NEUROLOGIC: Alert and oriented to person, place, and time. Normal reflexes, muscle tone coordination.  PSYCHIATRIC: Normal mood and affect. Normal behavior. Normal judgment and thought content. CARDIOVASCULAR: Normal heart rate noted, regular rhythm RESPIRATORY: Clear to auscultation bilaterally. Effort and breath sounds normal, no problems with respiration noted. BREASTS: Symmetric in size. No masses, tenderness, skin changes, nipple drainage, or lymphadenopathy bilaterally. Performed in the presence of a chaperone. ABDOMEN: Soft, no distention noted.  No tenderness, rebound or guarding.  PELVIC: Normal appearing external genitalia and urethral meatus; normal appearing vaginal mucosa and cervix.  No abnormal discharge noted.  Pap smear obtained.  Normal uterine size, no other palpable masses, no uterine or adnexal tenderness.  Performed in the presence of a chaperone.   Assessment and Plan:    1. Well woman exam Normal annual exam Pt will be using depo provera for contraception - Cytology - PAP  2. Skin lesion on examination By history sounds suspicious for hidradenitis supporativa, minor skin changes noted, will refer to Derm for further evaluation - Ambulatory referral to Dermatology  3. Secondary hypertension Pt is currently taking amlodipine, BP still elevated, she has follow up with PCP in 1-2 weeks  Will follow up results of pap smear and manage accordingly. Routine  preventative health maintenance measures emphasized. Please refer to After Visit Summary for other counseling recommendations.      04/12/2022, MD, FACOG Obstetrician & Gynecologist, [facility], [facility]

## 2022-02-19 NOTE — Progress Notes (Signed)
Pt presents for AEX. Pt interested in switching for Nuvaring to Depo. Last unprotected sex 01/2022. Pt states she [facility]. Pt declines STD testing today. Pt c/o recurring boils in the armpits and groin. No other concerns today.

## 2022-02-20 ENCOUNTER — Other Ambulatory Visit: Payer: Self-pay | Admitting: Family Medicine

## 2022-02-26 LAB — CYTOLOGY - PAP
Chlamydia: NEGATIVE
Comment: NEGATIVE
Comment: NORMAL
Diagnosis: NEGATIVE
Neisseria Gonorrhea: NEGATIVE

## 2022-06-04 ENCOUNTER — Other Ambulatory Visit: Payer: Self-pay | Admitting: Advanced Practice Midwife

## 2022-11-05 ENCOUNTER — Ambulatory Visit: Payer: Medicaid Other | Admitting: Dermatology

## 2023-04-08 ENCOUNTER — Telehealth: Payer: Self-pay | Admitting: Emergency Medicine

## 2023-04-08 NOTE — Telephone Encounter (Signed)
Attempted TC to patient. Patient needs apt for doxycycline refill

## 2023-04-26 ENCOUNTER — Telehealth: Payer: Medicaid Other | Admitting: Family

## 2023-04-26 DIAGNOSIS — M5440 Lumbago with sciatica, unspecified side: Secondary | ICD-10-CM | POA: Diagnosis not present

## 2023-04-26 MED ORDER — BACLOFEN 10 MG PO TABS: 10.0000 mg | ORAL_TABLET | Freq: Three times a day (TID) | ORAL | 0 refills | Status: DC | PRN

## 2023-04-26 MED ORDER — NAPROXEN 500 MG PO TABS: 500.0000 mg | ORAL_TABLET | Freq: Two times a day (BID) | ORAL | 0 refills | Status: DC

## 2023-04-26 NOTE — Progress Notes (Signed)
E-Visit for Back Pain   We are sorry that you are not feeling well.  Here is how we plan to help!  Based on what you have shared with me it looks like you mostly have acute back pain.  Acute back pain is defined as musculoskeletal pain that can resolve in 1-3 weeks with conservative treatment.  I have prescribed Naprosyn 500 mg take one by mouth twice a day non-steroid anti-inflammatory (NSAID) as well as Baclofen 10 mg every eight hours as needed which is a muscle relaxer  Some patients experience stomach irritation or in increased heartburn with anti-inflammatory drugs.  Please keep in mind that muscle relaxer's can cause fatigue and should not be taken while at work or driving.  Back pain is very common.  The pain often gets better over time.  The cause of back pain is usually not dangerous.  Most people can learn to manage their back pain on their own.  Home Care Stay active.  Start with short walks on flat ground if you can.  Try to walk farther each day. Do not sit, drive or stand in one place for more than 30 minutes.  Do not stay in bed. Do not avoid exercise or work.  Activity can help your back heal faster. Be careful when you bend or lift an object.  Bend at your knees, keep the object close to you, and do not twist. Sleep on a firm mattress.  Lie on your side, and bend your knees.  If you lie on your back, put a pillow under your knees. Only take medicines as told by your doctor. Put ice on the injured area. Put ice in a plastic bag Place a towel between your skin and the bag Leave the ice on for 15-20 minutes, 3-4 times a day for the first 2-3 days. 210 After that, you can switch between ice and heat packs. Ask your doctor about back exercises or massage. Avoid feeling anxious or stressed.  Find good ways to deal with stress, such as exercise.  Get Help Right Way If: Your pain does not go away with rest or medicine. Your pain does not go away in 1 week. You have new  problems. You do not feel well. The pain spreads into your legs. You cannot control when you poop (bowel movement) or pee (urinate) You feel sick to your stomach (nauseous) or throw up (vomit) You have belly (abdominal) pain. You feel like you may pass out (faint). If you develop a fever.  Make Sure you: Understand these instructions. Will watch your condition Will get help right away if you are not doing well or get worse.  Your e-visit answers were reviewed by a board certified advanced clinical practitioner to complete your personal care plan.  Depending on the condition, your plan could have included both over the counter or prescription medications.  If there is a problem please reply  once you have received a response from your provider.  Your safety is important to [place].  If you have drug allergies check your prescription carefully.    You can use MyChart to ask questions about today's visit, request a non-urgent call back, or ask for a work or school excuse for 24 hours related to this e-Visit. If it has been greater than 24 hours you will need to follow up with your provider, or enter a new e-Visit to address those concerns.  You will get an e-mail in the next two days asking about  your experience.  I hope that your e-visit has been valuable and will speed your recovery. Thank you for using e-visits.   Approximately 5 minutes was spent documenting and reviewing patient's chart.

## 2023-06-30 ENCOUNTER — Telehealth: Payer: Medicaid Other | Admitting: Physician Assistant

## 2023-06-30 DIAGNOSIS — H6691 Otitis media, unspecified, right ear: Secondary | ICD-10-CM

## 2023-07-01 MED ORDER — AMOXICILLIN 875 MG PO TABS: 875.0000 mg | ORAL_TABLET | Freq: Two times a day (BID) | ORAL | 0 refills | Status: AC

## 2023-07-01 NOTE — Progress Notes (Signed)
E-Visit for Ear Pain - Acute Otitis Media   We are sorry that you are not feeling well. Here is how we plan to help!  Based on what you have shared with me it looks like you have Acute Otitis Media.  Acute Otitis Media is an infection of the middle or "inner" ear. This type of infection can cause redness, inflammation, and fluid buildup behind the tympanic membrane (ear drum).  The usual symptoms include: Earache/Pain Fever Upper respiratory symptoms Lack of energy/Fatigue/Malaise Slight hearing loss gradually worsening- if the inner ear fills with fluid What causes middle ear infections? Most middle ear infections occur when an infection such as a cold, leads to a build-up of mucus in the middle ear and causes the Eustachian tube (a thin tube that runs from the middle ear to the back of the nose) to become swollen or blocked.   This means mucus can't drain away properly, making it easier for an infection to spread into the middle ear.  How middle ear infections are treated: Most ear infections clear up within three to five days and don't need any specific treatment. If necessary, tylenol or ibuprofen should be used to relieve pain and a high temperature.  If you develop a fever higher than 102, or any significantly worsening symptoms, this could indicate a more serious infection moving to the middle/inner and needs face to face evaluation in an office by a provider.   Antibiotics aren't routinely used to treat middle ear infections, although they may occasionally be prescribed if symptoms persist or are particularly severe. Given your presentation,   I have prescribed Amoxicillin 875 mg one tablet twice daily for 10 days   Your symptoms should improve over the next 3 days and should resolve in about 7 days. Be sure to complete ALL of the prescription(s) given.  Please complete and submit a second e-Visit questionnaire about back pain to help [place] clarify your symptoms.    You will not be  charged for the second questionnaire as we will void the charge for the second visit.  The additional questions and your answers are a vital part of reviewing your symptoms to determine the best course of treatment and appropriate medications.   Thank you.   HOME CARE: Wash your hands frequently. If you are prescribed an ear drop, do not place the tip of the bottle on your ear or touch it with your fingers. You can take Acetaminophen 650 mg every 4-6 hours as needed for pain.  If pain is severe or moderate, you can apply a heating pad (set on low) or hot water bottle (wrapped in a towel) to outer ear for 20 minutes.  This will also increase drainage.  GET HELP RIGHT AWAY IF: Fever is over 102.2 degrees. You develop progressive ear pain or hearing loss. Ear symptoms persist longer than 3 days after treatment.  MAKE SURE YOU: Understand these instructions. Will watch your condition. Will get help right away if you are not doing well or get worse.  Thank you for choosing an e-visit.  Your e-visit answers were reviewed by a board certified advanced clinical practitioner to complete your personal care plan. Depending upon the condition, your plan could have included both over the counter or prescription medications.  Please review your pharmacy choice. Make sure the pharmacy is open so you can pick up the prescription now. If there is a problem, you may contact your provider through MyChart messaging and have the prescription routed to  another pharmacy.  Your safety is important to [place]. If you have drug allergies check your prescription carefully.   For the next 24 hours you can use MyChart to ask questions about today's visit, request a non-urgent call back, or ask for a work or school excuse. You will get an email with a survey after your eVisit asking about your experience. We would appreciate your feedback. I hope that your e-visit has been valuable and will aid in your recovery.

## 2023-07-01 NOTE — Progress Notes (Signed)
I have spent 5 minutes in review of e-visit questionnaire, review and updating patient chart, medical decision making and response to patient.   Mia Milan Cody Jacklynn Dehaas, PA-C    

## 2023-09-19 ENCOUNTER — Telehealth: Admitting: Family

## 2023-09-19 DIAGNOSIS — J3489 Other specified disorders of nose and nasal sinuses: Secondary | ICD-10-CM | POA: Diagnosis not present

## 2023-09-19 MED ORDER — MUPIROCIN CALCIUM 2 % EX CREA: 1.0000 | TOPICAL_CREAM | Freq: Two times a day (BID) | CUTANEOUS | 0 refills | Status: DC

## 2023-09-19 NOTE — Progress Notes (Signed)
 We are sorry that you are not feeling well.  Here is how we plan to help!  Based on what you have shared with me it looks like you have a sore in your nose. I have sent in bactroban 2% to your pharmacy. This antibiotic cream you will place twice a day. Avoid picking.   HOME CARE INSTRUCTIONS  Only take over-the-counter or prescription medicines for pain, discomfort, or fever as directed by your caregiver. Do not use aspirin.   Use a cotton-tip swab to apply creams or gels to your sores.   Do not touch the sores or pick the scabs. Wash your hands often. Do not touch your eyes without washing your hands first.   Avoid kissing, oral sex, and sharing personal items until sores heal.   Apply an ice pack on your sores for 10-15 minutes to ease any discomfort.   Avoid hot, cold, or salty foods because they may hurt your mouth. Eat a soft, bland diet to avoid irritating the sores. Use a straw to drink if you have pain when drinking out of a glass.   Keep sores clean and dry to prevent an infection of other tissues.   Avoid the sun and limit stress if these things trigger outbreaks. If sun causes cold sores, apply sunscreen on the lips before being out in the sun.   SEEK MEDICAL CARE IF:  You have a fever or persistent symptoms for more than 2-3 days.   You have a fever and your symptoms suddenly get worse.   You have pus, not clear fluid, coming from the sores.   You have redness that is spreading.   You have pain or irritation in your eye.   You get sores on your genitals.   Your sores do not heal within 2 weeks.   You have a weakened immune system.   You have frequent recurrences of cold sores.     MAKE SURE YOU  Understand these instructions. Will watch your condition. Will get help right away if you are not doing well or get worse.  Your e-visit answers were reviewed by a board certified advanced clinical practitioner to complete your personal care plan.  Depending on the condition, your  plan could have included both over the counter or prescription medications.  If there is a problem please reply  once you have received a response from your provider.  Your safety is important to us .  If you have drug allergies check your prescription carefully.    You can use MyChart to ask questions about today's visit, request a non-urgent call back, or ask for a work or school excuse.  You will get an e-mail in the next two days asking about your experience.  I hope that your e-visit has been valuable and will speed your recovery. Thank you for using e-visits.   Approximately 5 minutes was spent documenting and reviewing patient's chart.

## 2023-09-24 ENCOUNTER — Telehealth: Payer: Self-pay

## 2023-09-24 ENCOUNTER — Other Ambulatory Visit (HOSPITAL_COMMUNITY): Payer: Self-pay

## 2023-09-24 NOTE — Telephone Encounter (Signed)
 Pharmacy Patient Advocate Encounter   Received notification from CoverMyMeds that prior authorization for Mupirocin Calcium 2% cream is required/requested.   Insurance verification completed.   The patient is insured through [facility] .   Per test claim: PA required; PA started via CoverMyMeds. KEY [id number] . Waiting for clinical questions to populate.

## 2023-09-25 ENCOUNTER — Other Ambulatory Visit (HOSPITAL_COMMUNITY): Payer: Self-pay

## 2023-09-25 ENCOUNTER — Telehealth: Admitting: Physician Assistant

## 2023-09-25 DIAGNOSIS — J3489 Other specified disorders of nose and nasal sinuses: Secondary | ICD-10-CM

## 2023-09-25 MED ORDER — MUPIROCIN 2 % EX OINT: 1.0000 | TOPICAL_OINTMENT | Freq: Two times a day (BID) | CUTANEOUS | 0 refills | Status: DC

## 2023-09-25 NOTE — Telephone Encounter (Signed)
 Pharmacy Patient Advocate Encounter  Received notification from [employer] that Prior Authorization for Mupirocin  Calcium  2% cream has been CANCELLED due to provider cbange to what was preferred drug.   PA #/Case ID/Reference #:  [id number]

## 2023-09-25 NOTE — Progress Notes (Signed)
 E Visit for Rash  We are sorry that you are not feeling well. Here is how we plan to help!  Topical mupirocin  ointment to be applied twice daily has been prescribed for the sore in the nose.   HOME CARE:  Take cool showers and avoid direct sunlight. Apply cool compress or wet dressings. Take a bath in an oatmeal bath.  Sprinkle content of one Aveeno packet under running faucet with comfortably warm water.  Bathe for 15-20 minutes, 1-2 times daily.  Pat dry with a towel. Do not rub the rash. Use hydrocortisone cream. Take an antihistamine like Benadryl  for widespread rashes that itch.  The adult dose of Benadryl  is 25-50 mg by mouth 4 times daily. Caution:  This type of medication may cause sleepiness.  Do not drink alcohol, drive, or operate dangerous machinery while taking antihistamines.  Do not take these medications if you have prostate enlargement.  Read package instructions thoroughly on all medications that you take.  GET HELP RIGHT AWAY IF:  Symptoms don't go away after treatment. Severe itching that persists. If you rash spreads or swells. If you rash begins to smell. If it blisters and opens or develops a yellow-brown crust. You develop a fever. You have a sore throat. You become short of breath.  MAKE SURE YOU:  Understand these instructions. Will watch your condition. Will get help right away if you are not doing well or get worse.  Thank you for choosing an e-visit.  Your e-visit answers were reviewed by a board certified advanced clinical practitioner to complete your personal care plan. Depending upon the condition, your plan could have included both over the counter or prescription medications.  Please review your pharmacy choice. Make sure the pharmacy is open so you can pick up prescription now. If there is a problem, you may contact your provider through [employer] and have the prescription routed to another pharmacy.  Your safety is important to us . If  you have drug allergies check your prescription carefully.   For the next 24 hours you can use MyChart to ask questions about today's visit, request a non-urgent call back, or ask for a work or school excuse. You will get an email in the next two days asking about your experience. I hope that your e-visit has been valuable and will speed your recovery.   I have spent 5 minutes in review of e-visit questionnaire, review and updating patient chart, medical decision making and response to patient.   Delon CHRISTELLA Dickinson, PA-C

## 2023-12-12 ENCOUNTER — Encounter: Admitting: Nurse Practitioner

## 2023-12-12 DIAGNOSIS — Z202 Contact with and (suspected) exposure to infections with a predominantly sexual mode of transmission: Secondary | ICD-10-CM

## 2023-12-12 NOTE — Progress Notes (Signed)
 Because your partner has chlamydia and your symptoms may be related to chlamydia, I feel your condition warrants further evaluation and I recommend that you be seen for a face to face visit. We do not treat for STDs.  Please contact your primary care physician practice, urgent care or local health department to be seen. Many offices offer virtual options to be seen via video if you are not comfortable going in person to a medical facility at this time.  NOTE: You will NOT be charged for this eVisit.  If you do not have a PCP, [facility] offers a free physician referral service available at [phone]. Our trained staff has the experience, knowledge and resources to put you in touch with a physician who is right for you.    If you are having a true medical emergency please call 911.   Your e-visit answers were reviewed by a board certified advanced clinical practitioner to complete your personal care plan.  Thank you for using e-Visits.

## 2023-12-12 NOTE — Progress Notes (Signed)
 I have spent 5 minutes in review of e-visit questionnaire, review and updating patient chart, medical decision making and response to patient.   Claiborne Rigg, NP

## 2023-12-20 ENCOUNTER — Telehealth: Admitting: Family

## 2023-12-20 DIAGNOSIS — R399 Unspecified symptoms and signs involving the genitourinary system: Secondary | ICD-10-CM | POA: Diagnosis not present

## 2023-12-20 MED ORDER — CEPHALEXIN 500 MG PO CAPS: 500.0000 mg | ORAL_CAPSULE | Freq: Two times a day (BID) | ORAL | 0 refills | Status: DC

## 2023-12-20 NOTE — Progress Notes (Signed)
  Because of your UTI symptoms with back pain and vaginal discharge, I feel your condition warrants further evaluation and I recommend that you be seen in a face-to-face visit.   NOTE: There will be NO CHARGE for this E-Visit   If you are having a true medical emergency, please call 911.     For an urgent face to face visit, [place] has multiple urgent care centers for your convenience.  Click the link below for the full list of locations and hours, walk-in wait times, appointment scheduling options and driving directions:  Urgent Care - [place], [place], [place], [place], [place], [place]  [place]     Your MyChart E-visit questionnaire answers were reviewed by a board certified advanced clinical practitioner to complete your personal care plan based on your specific symptoms.    Thank you for using e-Visits.

## 2023-12-20 NOTE — Progress Notes (Signed)

## 2023-12-20 NOTE — Addendum Note (Signed)
 Addended by: LAVELL LYE A on: 12/20/2023 11:42 AM   Modules accepted: Orders, Level of Service

## 2024-02-11 ENCOUNTER — Telehealth

## 2024-02-11 DIAGNOSIS — J301 Allergic rhinitis due to pollen: Secondary | ICD-10-CM | POA: Diagnosis not present

## 2024-02-11 DIAGNOSIS — J3489 Other specified disorders of nose and nasal sinuses: Secondary | ICD-10-CM

## 2024-02-11 MED ORDER — MUPIROCIN 2 % EX OINT
1.0000 | TOPICAL_OINTMENT | Freq: Two times a day (BID) | CUTANEOUS | 0 refills | Status: AC
Start: 2024-02-11 — End: ?

## 2024-02-11 MED ORDER — CETIRIZINE HCL 10 MG PO TABS: 10.0000 mg | ORAL_TABLET | Freq: Every day | ORAL | 0 refills | Status: AC

## 2024-02-11 MED ORDER — FLUTICASONE PROPIONATE 50 MCG/ACT NA SUSP
2.0000 | Freq: Every day | NASAL | 0 refills | Status: AC
Start: 2024-02-11 — End: ?

## 2024-02-11 NOTE — Progress Notes (Signed)
 E visit for Allergic Rhinitis We are sorry that you are not feeling well.  Here is how we plan to help!  Based on what you have shared with me it looks like you have Allergic Rhinitis.  Rhinitis is when a reaction occurs that causes nasal congestion, runny nose, sneezing, and itching.  Most types of rhinitis are caused by an inflammation and are associated with symptoms in the eyes ears or throat. There are several types of rhinitis.  The most common are acute rhinitis, which is usually caused by a viral illness, allergic or seasonal rhinitis, and nonallergic or year-round rhinitis.  Nasal allergies occur certain times of the year.  Allergic rhinitis is caused when allergens in the air trigger the release of histamine in the body.  Histamine causes itching, swelling, and fluid to build up in the fragile linings of the nasal passages, sinuses and eyelids.  An itchy nose and clear discharge are common.  I recommend the following over the counter treatments: You should take a daily dose of antihistamine I have prescribed Cetirizine  10mg  Take 1 tablet daily  I also would recommend a nasal spray: I have prescribed Flonase  2 sprays into each nostril once daily  You may also benefit from a nasal antibiotic ointment for the sore on the nose: I have prescribed Mupirocin  ointment Apply twice daily to affected area   HOME CARE:  You can use an over-the-counter saline nasal spray as needed Avoid areas where there is heavy dust, mites, or molds Stay indoors on windy days during the pollen season Keep windows closed in home, at least in bedroom; use air conditioner. Use high-efficiency house air filter Keep windows closed in car, turn AC on re-circulate Avoid playing out with dog during pollen season  GET HELP RIGHT AWAY IF:  If your symptoms do not improve within 10 days You become short of breath You develop yellow or green discharge from your nose for over 3 days You have coughing fits  MAKE  SURE YOU:  Understand these instructions Will watch your condition Will get help right away if you are not doing well or get worse  Thank you for choosing an e-visit. Your e-visit answers were reviewed by a board certified advanced clinical practitioner to complete your personal care plan. Depending upon the condition, your plan could have included both over the counter or prescription medications. Please review your pharmacy choice. Be sure that the pharmacy you have chosen is open so that you can pick up your prescription now.  If there is a problem you may message your provider in MyChart to have the prescription routed to another pharmacy. Your safety is important to us . If you have drug allergies check your prescription carefully.  For the next 24 hours, you can use MyChart to ask questions about today's visit, request a non-urgent call back, or ask for a work or school excuse from your e-visit provider. You will get an email in the next two days asking about your experience. I hope that your e-visit has been valuable and will speed your recovery.       I have spent 5 minutes in review of e-visit questionnaire, review and updating patient chart, medical decision making and response to patient.   Delon CHRISTELLA Dickinson, PA-C

## 2024-05-10 ENCOUNTER — Telehealth: Admitting: Physician Assistant

## 2024-05-10 DIAGNOSIS — K047 Periapical abscess without sinus: Secondary | ICD-10-CM

## 2024-05-10 MED ORDER — NAPROXEN 500 MG PO TABS: 500.0000 mg | ORAL_TABLET | Freq: Two times a day (BID) | ORAL | 0 refills | Status: AC

## 2024-05-10 MED ORDER — AMOXICILLIN-POT CLAVULANATE 875-125 MG PO TABS: 1.0000 | ORAL_TABLET | Freq: Two times a day (BID) | ORAL | 0 refills | Status: AC

## 2024-05-10 NOTE — Progress Notes (Signed)
 E-Visit for Dental Pain  We are sorry that you are not feeling well.  Here is how we plan to help!  Based on what you have shared with me in the questionnaire, it sounds like you have a dental infection.  I have prescribed Augmentin  875-125mg  twice a day for 7 days and Naprosyn  500mg  2 times a day for 7 days for discomfort  It is imperative that you see a dentist within 10 days of this eVisit to determine the cause of the dental pain and be sure it is adequately treated  A toothache or tooth pain is caused when the nerve in the root of a tooth or surrounding a tooth is irritated. Dental (tooth) infection, decay, injury, or loss of a tooth are the most common causes of dental pain. Pain may also occur after an extraction (tooth is pulled out). Pain sometimes originates from other areas and radiates to the jaw, thus appearing to be tooth pain.Bacteria growing inside your mouth can contribute to gum disease and dental decay, both of which can cause pain. A toothache occurs from inflammation of the central portion of the tooth called pulp. The pulp contains nerve endings that are very sensitive to pain. Inflammation to the pulp or pulpitis may be caused by dental cavities, trauma, and infection.    HOME CARE:   For toothaches: Over-the-counter pain medications such as acetaminophen  or ibuprofen  may be used. Take these as directed on the package while you arrange for a dental appointment. Avoid very cold or hot foods, because they may make the pain worse. You may get relief from biting on a cotton ball soaked in oil of cloves. You can get oil of cloves at most drug stores.  For jaw pain:  Aspirin may be helpful for problems in the joint of the jaw in adults. If pain happens every time you open your mouth widely, the temporomandibular joint (TMJ) may be the source of the pain. Yawning or taking a large bite of food may worsen the pain. An appointment with your doctor or dentist will help you find the  cause.     GET HELP RIGHT AWAY IF:  You have a high fever or chills If you have had a recent head or face injury and develop headache, light headedness, nausea, vomiting, or other symptoms that concern you after an injury to your face or mouth, you could have a more serious injury in addition to your dental injury. A facial rash associated with a toothache: This condition may improve with medication. Contact your doctor for them to decide what is appropriate. Any jaw pain occurring with chest pain: Although jaw pain is most commonly caused by dental disease, it is sometimes referred pain from other areas. People with heart disease, especially people who have had stents placed, people with diabetes, or those who have had heart surgery may have jaw pain as a symptom of heart attack or angina. If your jaw or tooth pain is associated with lightheadedness, sweating, or shortness of breath, you should see a doctor as soon as possible. Trouble swallowing or excessive pain or bleeding from gums: If you have a history of a weakened immune system, diabetes, or steroid use, you may be more susceptible to infections. Infections can often be more severe and extensive or caused by unusual organisms. Dental and gum infections in people with these conditions may require more aggressive treatment. An abscess may need draining or IV antibiotics, for example.  MAKE SURE YOU  Understand these instructions. Will watch your condition. Will get help right away if you are not doing well or get worse.  Thank you for choosing an e-visit.  Your e-visit answers were reviewed by a board certified advanced clinical practitioner to complete your personal care plan. Depending upon the condition, your plan could have included both over the counter or prescription medications.  Please review your pharmacy choice. Make sure the pharmacy is open so you can pick up prescription now. If there is a problem, you may contact your  provider through [employer] and have the prescription routed to another pharmacy.  Your safety is important to us . If you have drug allergies check your prescription carefully.   For the next 24 hours you can use MyChart to ask questions about today's visit, request a non-urgent call back, or ask for a work or school excuse. You will get an email in the next two days asking about your experience. I hope that your e-visit has been valuable and will speed your recovery.  I have spent 5 minutes in review of e-visit questionnaire, review and updating patient chart, medical decision making and response to patient.   Elsie Velma Lunger, PA-C
